# Patient Record
Sex: Female | Born: 1945
Health system: Southern US, Community
[De-identification: ages and names within clinical notes are randomized; demographics above are authoritative.]

## PROBLEM LIST (undated history)

## (undated) DIAGNOSIS — M353 Polymyalgia rheumatica: Secondary | ICD-10-CM

## (undated) DIAGNOSIS — N189 Chronic kidney disease, unspecified: Secondary | ICD-10-CM

## (undated) DIAGNOSIS — I10 Essential (primary) hypertension: Secondary | ICD-10-CM

## (undated) DIAGNOSIS — M1990 Unspecified osteoarthritis, unspecified site: Secondary | ICD-10-CM

## (undated) DIAGNOSIS — A09 Infectious gastroenteritis and colitis, unspecified: Secondary | ICD-10-CM

## (undated) DIAGNOSIS — E785 Hyperlipidemia, unspecified: Secondary | ICD-10-CM

## (undated) DIAGNOSIS — D496 Neoplasm of unspecified behavior of brain: Secondary | ICD-10-CM

## (undated) HISTORY — PX: ABDOMINAL HYSTERECTOMY: SHX81

## (undated) HISTORY — PX: CRANIECTOMY FOR EXCISION OF ACOUSTIC NEUROMA: SUR324

## (undated) HISTORY — DX: Hyperlipidemia, unspecified: E78.5

## (undated) HISTORY — DX: Essential (primary) hypertension: I10

## (undated) HISTORY — DX: Infectious gastroenteritis and colitis, unspecified: A09

## (undated) HISTORY — DX: Polymyalgia rheumatica: M35.3

## (undated) HISTORY — PX: COLONOSCOPY: SHX174

---

## 2004-04-21 ENCOUNTER — Ambulatory Visit: Payer: Self-pay | Admitting: Internal Medicine

## 2004-05-16 ENCOUNTER — Ambulatory Visit: Payer: Self-pay | Admitting: Internal Medicine

## 2005-09-27 ENCOUNTER — Ambulatory Visit: Payer: Self-pay | Admitting: Infectious Diseases

## 2007-01-13 ENCOUNTER — Ambulatory Visit: Payer: Self-pay | Admitting: Internal Medicine

## 2008-08-19 LAB — HM DEXA SCAN: HM Dexa Scan: NORMAL

## 2008-09-23 ENCOUNTER — Ambulatory Visit: Payer: Self-pay | Admitting: Internal Medicine

## 2011-02-02 ENCOUNTER — Telehealth: Payer: Self-pay | Admitting: *Deleted

## 2011-02-02 DIAGNOSIS — Z780 Asymptomatic menopausal state: Secondary | ICD-10-CM

## 2011-02-02 MED ORDER — ESTRADIOL 0.05 MG/24HR TD PTTW: 1.0000 | MEDICATED_PATCH | TRANSDERMAL | Status: DC

## 2011-02-02 NOTE — Telephone Encounter (Signed)
Patient is currently on Estradiol 0.5 mg tabs daily, but it is on back order, Pharmacy is asking if she can be switched to patches. They currently have 0.075mg  and 0.05mg  available.

## 2011-02-02 NOTE — Telephone Encounter (Signed)
vivelle patch was prescirbed to [facility]

## 2011-03-14 ENCOUNTER — Ambulatory Visit (INDEPENDENT_AMBULATORY_CARE_PROVIDER_SITE_OTHER): Payer: Medicare Other | Admitting: Internal Medicine

## 2011-03-14 ENCOUNTER — Encounter: Payer: Self-pay | Admitting: Internal Medicine

## 2011-03-14 DIAGNOSIS — Z780 Asymptomatic menopausal state: Secondary | ICD-10-CM

## 2011-03-14 DIAGNOSIS — Z79899 Other long term (current) drug therapy: Secondary | ICD-10-CM

## 2011-03-14 DIAGNOSIS — M353 Polymyalgia rheumatica: Secondary | ICD-10-CM

## 2011-03-14 DIAGNOSIS — Z23 Encounter for immunization: Secondary | ICD-10-CM

## 2011-03-14 DIAGNOSIS — I10 Essential (primary) hypertension: Secondary | ICD-10-CM

## 2011-03-14 DIAGNOSIS — E785 Hyperlipidemia, unspecified: Secondary | ICD-10-CM

## 2011-03-14 DIAGNOSIS — N951 Menopausal and female climacteric states: Secondary | ICD-10-CM

## 2011-03-14 MED ORDER — SIMVASTATIN 40 MG PO TABS: 40.0000 mg | ORAL_TABLET | Freq: Every day | ORAL | Status: DC

## 2011-03-14 MED ORDER — ESTRADIOL 0.05 MG/24HR TD PTTW: 1.0000 | MEDICATED_PATCH | TRANSDERMAL | Status: DC

## 2011-03-14 NOTE — Progress Notes (Signed)
  Subjective:    Patient ID: Kaitlyn Roman, female    DOB: 1946-01-13, 65 y.o.   MRN: [id number]  HPI   [name] is a 65 yo white female with a history of hypertension, hyperlipidemia and  polymyalgia rheumatica who presents for 6 month followup.  She has been having increased pain in both hand and both feet, without any change in activity. Her foot pain has made exercise difficult to fo. Sheha snot taken prednisone in several months sHer rheumoatologist wants [place] to check ESR and x rays prior to f/u appt for PMR.   She  denies myalgias, any recent trauma. No fevers, hip or shoulder pain.  Past Medical History  Diagnosis Date  . Polymyalgia rheumatica syndrome     rheumatoloigst Dr Freida Busman at [facility]  . Hypertension     No current outpatient prescriptions on file prior to visit.     Review of Systems  Constitutional: Negative for fever, chills, fatigue and unexpected weight change.  HENT: Negative for hearing loss, ear pain, nosebleeds, congestion, sore throat, facial swelling, rhinorrhea, sneezing, mouth sores, trouble swallowing, neck pain, neck stiffness, voice change, postnasal drip, sinus pressure, tinnitus and ear discharge.   Eyes: Negative for pain, discharge, redness and visual disturbance.  Respiratory: Negative for cough, chest tightness, shortness of breath, wheezing and stridor.   Cardiovascular: Negative for chest pain, palpitations and leg swelling.  Musculoskeletal: Positive for joint swelling and arthralgias. Negative for myalgias.  Skin: Negative for color change and rash.  Neurological: Negative for dizziness, weakness, light-headedness and headaches.  Hematological: Negative for adenopathy.       Objective:   Physical Exam  Constitutional: She is oriented to person, place, and time. She appears well-developed and well-nourished.  HENT:  Mouth/Throat: Oropharynx is clear and moist.  Eyes: EOM are normal. Pupils are equal, round, and reactive to light. No  scleral icterus.  Neck: Normal range of motion. Neck supple. No JVD present. No thyromegaly present.  Cardiovascular: Normal rate, regular rhythm, normal heart sounds and intact distal pulses.   Pulmonary/Chest: Effort normal and breath sounds normal.  Abdominal: Soft. Bowel sounds are normal. She exhibits no mass. There is no tenderness.  Musculoskeletal: Normal range of motion. She exhibits tenderness. She exhibits no edema.       Left ankle: tenderness. Head of 5th metatarsal tenderness found.       Feet:  Lymphadenopathy:    She has no cervical adenopathy.  Neurological: She is alert and oriented to person, place, and time.  Skin: Skin is warm and dry.  Psychiatric: She has a normal mood and affect.          Assessment & Plan:  Joint pain:  The joints currently affected are not typical for PMR. However, her rheumatologist is requesting radiographs of hands and feet. ESR was normal, so the more likely cause of current symptoms is OA

## 2011-03-14 NOTE — Patient Instructions (Signed)
We will send results of x rays and labs to your rheumatologist  Dr. Freida Busman  Return for fasting labs as soon as convenient.  followup in  6 months

## 2011-03-15 ENCOUNTER — Telehealth: Payer: Self-pay | Admitting: *Deleted

## 2011-03-15 ENCOUNTER — Other Ambulatory Visit (INDEPENDENT_AMBULATORY_CARE_PROVIDER_SITE_OTHER): Payer: Medicare Other | Admitting: *Deleted

## 2011-03-15 DIAGNOSIS — Z79899 Other long term (current) drug therapy: Secondary | ICD-10-CM

## 2011-03-15 DIAGNOSIS — E785 Hyperlipidemia, unspecified: Secondary | ICD-10-CM

## 2011-03-15 DIAGNOSIS — M353 Polymyalgia rheumatica: Secondary | ICD-10-CM

## 2011-03-15 LAB — COMPREHENSIVE METABOLIC PANEL
ALT: 13 U/L (ref 0–35)
AST: 22 U/L (ref 0–37)
Albumin: 4.5 g/dL (ref 3.5–5.2)
Alkaline Phosphatase: 59 U/L (ref 39–117)
BUN: 22 mg/dL (ref 6–23)
CO2: 28 mEq/L (ref 19–32)
Calcium: 9.3 mg/dL (ref 8.4–10.5)
Chloride: 103 mEq/L (ref 96–112)
Creatinine, Ser: 0.8 mg/dL (ref 0.4–1.2)
GFR: 79.32 mL/min (ref >60.00)
Glucose, Bld: 94 mg/dL (ref 70–99)
Potassium: 4.2 mEq/L (ref 3.5–5.1)
Sodium: 140 mEq/L (ref 135–145)
Total Bilirubin: 0.8 mg/dL (ref 0.3–1.2)
Total Protein: 7.5 g/dL (ref 6.0–8.3)

## 2011-03-15 LAB — SEDIMENTATION RATE: Sed Rate: 15 mm/hr (ref 0–22)

## 2011-03-15 LAB — LIPID PANEL
Cholesterol: 218 mg/dL — ABNORMAL HIGH (ref 0–200)
HDL: 83.9 mg/dL (ref >39.00)
Total CHOL/HDL Ratio: 3
Triglycerides: 77 mg/dL (ref 0.0–149.0)
VLDL: 15.4 mg/dL (ref 0.0–40.0)

## 2011-03-15 LAB — LDL CHOLESTEROL, DIRECT: Direct LDL: 122.8 mg/dL

## 2011-03-15 MED ORDER — BENZONATATE 200 MG PO CAPS: 200.0000 mg | ORAL_CAPSULE | Freq: Three times a day (TID) | ORAL | Status: AC | PRN

## 2011-03-15 NOTE — Telephone Encounter (Signed)
Without fevers or productive cough she does not need an antibiotic because it is most likely viral. .  You can offer her a cough medicine tessalon 200 mg one tablet every 8 hours prn cough  #30 1 refill.

## 2011-03-15 NOTE — Telephone Encounter (Signed)
Patient says that she forgot to mention to you at her visit yesterday that she is having some congestion, a non productive cough. Says that it is much worse at night and feels like her chest is tight when she is laying down. Has had no fever or any other symptoms. She is asking if she could have something called in since she was just seen yesterday.

## 2011-03-15 NOTE — Telephone Encounter (Signed)
Patient notified by message, rx for tessalon sent to pharmacy.

## 2011-03-16 MED ORDER — ATORVASTATIN CALCIUM 40 MG PO TABS: 40.0000 mg | ORAL_TABLET | Freq: Every day | ORAL | Status: DC

## 2011-03-16 NOTE — Progress Notes (Signed)
Addended by: Duncan Dull on: 03/16/2011 09:03 AM   Modules accepted: Orders

## 2011-03-17 ENCOUNTER — Encounter: Payer: Self-pay | Admitting: Internal Medicine

## 2011-03-17 DIAGNOSIS — E785 Hyperlipidemia, unspecified: Secondary | ICD-10-CM | POA: Insufficient documentation

## 2011-03-17 DIAGNOSIS — I10 Essential (primary) hypertension: Secondary | ICD-10-CM | POA: Insufficient documentation

## 2011-03-17 NOTE — Assessment & Plan Note (Signed)
Well controlled currently wih no evidence of end organ damage by current BMET.  No changes today

## 2011-03-17 NOTE — Assessment & Plan Note (Addendum)
LDL currently 122 and HDL 84 on lipitor.  Given her high HDL, will not increase dose of  Statin

## 2011-03-19 ENCOUNTER — Telehealth: Payer: Self-pay | Admitting: *Deleted

## 2011-03-19 MED ORDER — AZITHROMYCIN 500 MG PO TABS: 500.0000 mg | ORAL_TABLET | Freq: Every day | ORAL | Status: AC

## 2011-03-19 NOTE — Telephone Encounter (Signed)
Since it is now going on for over 5 days,  Please call her in Azithromycin 500 mg one tablet daily for 7 days #7 no refills. Sudafed PR 10 mg every 6 hours as needed for congestion.

## 2011-03-19 NOTE — Telephone Encounter (Signed)
Patient notified

## 2011-03-19 NOTE — Telephone Encounter (Signed)
Patient says that she is still having a lot of congestion, still has cough (non productive). She has has low grade fever a couple of times. She is taking the tessalon and has also tried mucinex and nothing is helping. She is asking if there is anything else that she can try.

## 2011-03-23 ENCOUNTER — Ambulatory Visit: Payer: Self-pay | Admitting: Internal Medicine

## 2011-04-04 ENCOUNTER — Encounter: Payer: Self-pay | Admitting: Internal Medicine

## 2011-05-15 ENCOUNTER — Other Ambulatory Visit: Payer: Self-pay | Admitting: Internal Medicine

## 2011-05-15 DIAGNOSIS — E785 Hyperlipidemia, unspecified: Secondary | ICD-10-CM

## 2011-05-15 DIAGNOSIS — Z780 Asymptomatic menopausal state: Secondary | ICD-10-CM

## 2011-05-15 MED ORDER — FLUOCINONIDE 0.05 % EX OINT: 1.0000 "application " | TOPICAL_OINTMENT | Freq: Two times a day (BID) | CUTANEOUS | Status: DC

## 2011-05-15 MED ORDER — ESTRADIOL 0.05 MG/24HR TD PTTW: 1.0000 | MEDICATED_PATCH | TRANSDERMAL | Status: DC

## 2011-05-15 MED ORDER — MELOXICAM 15 MG PO TABS: 15.0000 mg | ORAL_TABLET | Freq: Every day | ORAL | Status: DC

## 2011-05-15 MED ORDER — LOSARTAN POTASSIUM 100 MG PO TABS: 100.0000 mg | ORAL_TABLET | Freq: Every day | ORAL | Status: DC

## 2011-05-15 MED ORDER — ATORVASTATIN CALCIUM 40 MG PO TABS: 40.0000 mg | ORAL_TABLET | Freq: Every day | ORAL | Status: DC

## 2011-05-15 NOTE — Telephone Encounter (Signed)
New rx's printed >  Only 4 refills on some of them because she will need CMET in April for monitoring of liver/kidney funcction

## 2011-05-15 NOTE — Telephone Encounter (Signed)
Patient needs written scripts for all of these meds, she wants [place] to call her when they are ready and she will pick them up.

## 2011-05-15 NOTE — Telephone Encounter (Signed)
Patient brought in a list of medication that she needs a paper prescription for.  They were Fluocinonide ointment USP,0.05%, atorvastatin 40 mg, esterdiol 0.5 mg tab not the patch, losartin 100 mg, and meloxiam 15 mg.  She would have them called into a pharmacy however she is changing insurance companies at the first of the year.  Please call when they are ready for pick up and she will pick them up. Thanks

## 2011-05-16 NOTE — Telephone Encounter (Signed)
Rx's ready for pick up will be left at front desk.  Left message on cell phone voicemail advising patient as instructed.

## 2011-06-04 ENCOUNTER — Telehealth: Payer: Self-pay | Admitting: Internal Medicine

## 2011-06-04 MED ORDER — ESTRADIOL 0.5 MG PO TABS: 0.5000 mg | ORAL_TABLET | Freq: Every day | ORAL | Status: DC

## 2011-06-04 NOTE — Telephone Encounter (Signed)
Thank you :)

## 2011-06-04 NOTE — Telephone Encounter (Signed)
Tablets sent to [facility].

## 2011-06-04 NOTE — Telephone Encounter (Signed)
Patient is needing a prescription correction . Her estradiol was sent into [facility] as a patch when she takes a tablet at 0.5 mg.

## 2011-06-06 ENCOUNTER — Other Ambulatory Visit: Payer: Self-pay | Admitting: *Deleted

## 2011-06-06 MED ORDER — ESTRADIOL 0.5 MG PO TABS: 0.5000 mg | ORAL_TABLET | Freq: Every day | ORAL | Status: DC

## 2011-06-06 NOTE — Telephone Encounter (Signed)
Rx was sent to wrong pharmacy yesterday. Sent rx to [facility] and canceled at [facility].

## 2011-08-20 DIAGNOSIS — H269 Unspecified cataract: Secondary | ICD-10-CM | POA: Diagnosis not present

## 2011-08-20 DIAGNOSIS — Z961 Presence of intraocular lens: Secondary | ICD-10-CM | POA: Diagnosis not present

## 2011-08-20 DIAGNOSIS — H2510 Age-related nuclear cataract, unspecified eye: Secondary | ICD-10-CM | POA: Diagnosis not present

## 2011-08-21 DIAGNOSIS — H2510 Age-related nuclear cataract, unspecified eye: Secondary | ICD-10-CM | POA: Diagnosis not present

## 2011-08-27 DIAGNOSIS — A09 Infectious gastroenteritis and colitis, unspecified: Secondary | ICD-10-CM

## 2011-08-27 HISTORY — DX: Infectious gastroenteritis and colitis, unspecified: A09

## 2011-08-28 ENCOUNTER — Ambulatory Visit (INDEPENDENT_AMBULATORY_CARE_PROVIDER_SITE_OTHER): Payer: Medicare Other | Admitting: Internal Medicine

## 2011-08-28 ENCOUNTER — Encounter: Payer: Self-pay | Admitting: Internal Medicine

## 2011-08-28 VITALS — BP 144/80 | HR 70 | Temp 98.5°F | Resp 16 | Wt 127.8 lb

## 2011-08-28 DIAGNOSIS — M4847XD Fatigue fracture of vertebra, lumbosacral region, subsequent encounter for fracture with routine healing: Secondary | ICD-10-CM

## 2011-08-28 DIAGNOSIS — R5383 Other fatigue: Secondary | ICD-10-CM

## 2011-08-28 DIAGNOSIS — E538 Deficiency of other specified B group vitamins: Secondary | ICD-10-CM | POA: Diagnosis not present

## 2011-08-28 DIAGNOSIS — M1990 Unspecified osteoarthritis, unspecified site: Secondary | ICD-10-CM

## 2011-08-28 DIAGNOSIS — J069 Acute upper respiratory infection, unspecified: Secondary | ICD-10-CM

## 2011-08-28 DIAGNOSIS — Z79899 Other long term (current) drug therapy: Secondary | ICD-10-CM

## 2011-08-28 DIAGNOSIS — M353 Polymyalgia rheumatica: Secondary | ICD-10-CM | POA: Diagnosis not present

## 2011-08-28 DIAGNOSIS — E785 Hyperlipidemia, unspecified: Secondary | ICD-10-CM | POA: Diagnosis not present

## 2011-08-28 DIAGNOSIS — G629 Polyneuropathy, unspecified: Secondary | ICD-10-CM | POA: Insufficient documentation

## 2011-08-28 DIAGNOSIS — I10 Essential (primary) hypertension: Secondary | ICD-10-CM | POA: Diagnosis not present

## 2011-08-28 LAB — COMPLETE METABOLIC PANEL WITH GFR
ALT: 12 U/L (ref 0–35)
AST: 24 U/L (ref 0–37)
Albumin: 4.5 g/dL (ref 3.5–5.2)
Alkaline Phosphatase: 62 U/L (ref 39–117)
BUN: 26 mg/dL — ABNORMAL HIGH (ref 6–23)
CO2: 22 mEq/L (ref 19–32)
Calcium: 9.4 mg/dL (ref 8.4–10.5)
Chloride: 104 mEq/L (ref 96–112)
Creat: 0.67 mg/dL (ref 0.50–1.10)
GFR, Est African American: >89 mL/min
GFR, Est Non African American: >89 mL/min
Glucose, Bld: 91 mg/dL (ref 70–99)
Potassium: 4.7 mEq/L (ref 3.5–5.3)
Sodium: 139 mEq/L (ref 135–145)
Total Bilirubin: 0.5 mg/dL (ref 0.3–1.2)
Total Protein: 6.7 g/dL (ref 6.0–8.3)

## 2011-08-28 LAB — LIPID PANEL
Cholesterol: 187 mg/dL (ref 0–200)
HDL: 76.1 mg/dL (ref >39.00)
LDL Cholesterol: 99 mg/dL (ref 0–99)
Total CHOL/HDL Ratio: 2
Triglycerides: 62 mg/dL (ref 0.0–149.0)
VLDL: 12.4 mg/dL (ref 0.0–40.0)

## 2011-08-28 LAB — VITAMIN B12: Vitamin B-12: 765 pg/mL (ref 211–911)

## 2011-08-28 LAB — TSH: TSH: 1.28 u[IU]/mL (ref 0.35–5.50)

## 2011-08-28 MED ORDER — LOSARTAN POTASSIUM 100 MG PO TABS: 100.0000 mg | ORAL_TABLET | Freq: Every day | ORAL | Status: DC

## 2011-08-28 MED ORDER — MELOXICAM 15 MG PO TABS: 15.0000 mg | ORAL_TABLET | Freq: Every day | ORAL | Status: DC

## 2011-08-28 MED ORDER — ATORVASTATIN CALCIUM 40 MG PO TABS: 40.0000 mg | ORAL_TABLET | Freq: Every day | ORAL | Status: DC

## 2011-08-28 NOTE — Patient Instructions (Signed)
You have a viral  Syndrome .  The post nasal drip is causing your sore throat.  Lavage your sinuses twice daily with Simply Saline nasal spray.  Use benadryl 25 mg every 8 hours and Sudafed PE 10 to 30 every 8 hours as needed for post nasal drainage and congestion.  Gargle with salt water as needed for sore throat.  Mucinex thins out mucus and breaks it up., (d = decongestant,  Dm = cough suppressant)  I will call  in Cheratussin cough syrup (has codeine) for the cough.  If the throat is no better  In 3 to 4 days OR  if you develop T > 100.4,  Green nasal discharge,  Or facial pain,  Call for an antibiotic.

## 2011-08-28 NOTE — Assessment & Plan Note (Signed)
Bilateral parasthesias affecting tips of both hands .  Negative Tinels and Phalen's sign,  Recommended cervical neck pillow. Serologies to ule ou  12 and thyroid deficiency

## 2011-08-28 NOTE — Assessment & Plan Note (Signed)
Now off of prednisone, using meloxicm and prn tramadol

## 2011-08-28 NOTE — Assessment & Plan Note (Signed)
Well controlled on current medications.  No changes today. 

## 2011-08-28 NOTE — Progress Notes (Signed)
Patient ID: Kaitlyn Roman, female   DOB: 1946-05-15, 66 y.o.   MRN: [id number]    Patient Active Problem List  Diagnoses  . Hyperlipidemia  . Hypertension  . OA (osteoarthritis)  . Polymyalgia rheumatica  . URI (upper respiratory infection)  . Neuropathy    Subjective:  CC:   Chief Complaint  Patient presents with  . Follow-up    HPI:   Kaitlyn O'Ferrellis a 66 y.o. female who presents 6 month follow up on PMR, hypertension and hyperlipidemia,  Kaitlyn Roman did her Right cataract surgery with lens implant  last Monday .  Left is  scheduled for April 22 .  No complications thus far.  Her arthritis is now managed with meloxicam and tramadol,  Has not been taking prednisone for several months.  Has noted tingling,  parasthesias in the tips of fingers of both hands,  More prominent in the early morning. Has been suffering from sinus congestion and PND of two days duration.  6 month follow up on PMR, hypertension and hyperlipidemia,  Kaitlyn Roman did her Right cataract surgery with lens implant  last Monday .  Left is  scheduled for April 22 .  No complications thus far.  Her arthritis is now managed with meloxicam and tramadol,  Has not been taking prednisone for several months.  Has noted tingling,  parasthesias in the tips of fingers of both hands,  More prominent in the early morning. Has been suffering from sinus congestion and PND of two days duration.    Past Medical History  Diagnosis Date  . Polymyalgia rheumatica syndrome     rheumatoloigst Dr Freida Busman at [facility]  . Hyperlipidemia   . Hypertension     No past surgical history on file.       The following portions of the patient's history were reviewed and updated as appropriate: Allergies, current medications, and problem list.    Review of Systems:   12 Pt  review of systems was negative except those addressed in the HPI,     History   Social History  . Marital Status: Married    Spouse Name: N/A      Number of Children: N/A  . Years of Education: N/A   Occupational History  . Not on file.   Social History Main Topics  . Smoking status: Never Smoker   . Smokeless tobacco: Never Used  . Alcohol Use: Yes     one cocctail nightly   . Drug Use: No  . Sexually Active: Not on file   Other Topics Concern  . Not on file   Social History Narrative  . No narrative on file    Objective:  BP 144/80  Pulse 70  Temp(Src) 98.5 F (36.9 C) (Oral)  Resp 16  Wt 127 lb 12 oz (57.947 kg)  SpO2 100%  General appearance: alert, cooperative and appears stated age Ears: normal TM's and external ear canals both ears Throat: lips, mucosa, and tongue normal; teeth and gums normal Neck: no adenopathy, no carotid bruit, supple, symmetrical, trachea midline and thyroid not enlarged, symmetric, no tenderness/mass/nodules Back: symmetric, no curvature. ROM normal. No CVA tenderness. Lungs: clear to auscultation bilaterally Heart: regular rate and rhythm, S1, S2 normal, no murmur, click, rub or gallop Abdomen: soft, non-tender; bowel sounds normal; no masses,  no organomegaly Pulses: 2+ and symmetric Skin: Skin color, texture, turgor normal. No rashes or lesions Lymph nodes: Cervical, supraclavicular, and axillary nodes normal.  Assessment and Plan:  Hypertension Well  controlled on current medications.  No changes today.  Polymyalgia rheumatica Now off of prednisone, using meloxicm and prn tramadol   Neuropathy Bilateral parasthesias affecting tips of both hands .  Negative Tinels and Phalen's sign,  Recommended cervical neck pillow. Serologies to ule ou  12 and thyroid deficiency     Updated Medication List Outpatient Encounter Prescriptions as of 08/28/2011  Medication Sig Dispense Refill  . aspirin 81 MG tablet Take 81 mg by mouth daily.      [name] atorvastatin (LIPITOR) 40 MG tablet Take 1 tablet (40 mg total) by mouth daily.  30 tablet  4  . estradiol (ESTRACE) 0.5 MG tablet Take 1  tablet (0.5 mg total) by mouth daily.  30 tablet  11  . fluocinonide ointment (LIDEX) 0.05 % Apply 1 application topically 2 (two) times daily.  30 g  11  . losartan (COZAAR) 100 MG tablet Take 1 tablet (100 mg total) by mouth daily.  30 tablet  4  . meloxicam (MOBIC) 15 MG tablet Take 1 tablet (15 mg total) by mouth daily.  30 tablet  4  . traMADol (ULTRAM) 50 MG tablet Take 50 mg by mouth every 6 (six) hours as needed.        [name] DISCONTD: atorvastatin (LIPITOR) 40 MG tablet Take 1 tablet (40 mg total) by mouth daily.  30 tablet  4  . DISCONTD: losartan (COZAAR) 100 MG tablet Take 1 tablet (100 mg total) by mouth daily.  30 tablet  4  . DISCONTD: meloxicam (MOBIC) 15 MG tablet Take 1 tablet (15 mg total) by mouth daily.  30 tablet  4     Orders Placed This Encounter  Procedures  . TSH  . Lipid panel  . COMPLETE METABOLIC PANEL WITH GFR  . Vitamin B12    No Follow-up on file.

## 2011-08-31 ENCOUNTER — Telehealth: Payer: Self-pay | Admitting: Internal Medicine

## 2011-08-31 DIAGNOSIS — J329 Chronic sinusitis, unspecified: Secondary | ICD-10-CM

## 2011-08-31 MED ORDER — AMOXICILLIN-POT CLAVULANATE 875-125 MG PO TABS: 1.0000 | ORAL_TABLET | Freq: Two times a day (BID) | ORAL | Status: AC

## 2011-08-31 NOTE — Telephone Encounter (Signed)
Patient notified of Rx.  

## 2011-08-31 NOTE — Telephone Encounter (Signed)
Caller: Kaitlyn Roman/Patient; PCP: Duncan Dull; CB#: [phone]; ; ; Call regarding Prescription.  Seen in office 08/28/11 and told had viral syndrome, but if sore throat and sinus congestion did not improve, to call back and Dr. Darrick Huntsman would call in antibiotic.  Per epic, note from visit 08/28/11 does say to call back for antibiotic if not improved in 3-4 days.  INFO TO OFFICE FOR PROVIDER REVIEW/RX/CALLBACK.  Uses [facility]/[address]  MAY REACH PATIENT AT [phone] home or [phone] cell.

## 2011-08-31 NOTE — Telephone Encounter (Signed)
rx for augmentin sent via e mail to [facility] for patient's continued sinus symptoms.  contineu decongestants, saline rinses etc.

## 2011-08-31 NOTE — Telephone Encounter (Signed)
[facility] Triage Call Report Triage Record Num: [id number] Operator: Chevis Pretty Patient Name: Kaitlyn Roman Call Date & Time: 08/31/2011 9:49:07AM Patient Phone: [phone] PCP: Duncan Dull Patient Gender: Female PCP Fax : [phone] Patient DOB: 16-Nov-1945 Practice Name: [facility] Reason for Call: Caller: [name]; PCP: Duncan Dull; CB#: [phone]; ; ; Call regarding Prescription. Seen in office 08/28/11 and told had viral syndrome, but if sore throat and sinus congestion did not improve, to call back and Dr. Darrick Huntsman would call in antibiotic. Per epic, note from visit 08/28/11 does say to call back for antibiotic if not improved in 3-4 days. INFO TO OFFICE FOR PROVIDER REVIEW/RX/CALLBACK. Uses [facility] MAY REACH PATIENT AT [phone] home or [phone] cell. Protocol(s) Used: Medication Questions - Adult Recommended Outcome per Protocol: Call Dispensing Pharmacy or Provider Immediately Reason for Outcome: Requests refill of medication that poses clinical risk to patient if not available within 8 hours Care Advice: ~ Have pharmacy phone number and prescription information available when you speak with provider. If valid refills are available, it will be noted on original container. If container no longer available, prescribing pharmacy will have a record if refills are available. ~ ~ Ask pharmacist if they can do a partial refill. 08/31/2011 9:57:09AM Page 1 of 1 [email or web address]

## 2011-08-31 NOTE — Telephone Encounter (Signed)
See below note which did not get routed to you and forward to patient .  thanks

## 2011-09-05 ENCOUNTER — Telehealth: Payer: Self-pay | Admitting: Internal Medicine

## 2011-09-05 MED ORDER — GUAIFENESIN-CODEINE 100-10 MG/5ML PO SYRP: ORAL_SOLUTION | ORAL | Status: DC

## 2011-09-05 NOTE — Telephone Encounter (Signed)
Ok to call in cheratussin 1 tablesppon every 6 hours as needed for cough 200 ml

## 2011-09-05 NOTE — Telephone Encounter (Signed)
Patient is calling [phone] to ask if Rx for cough syrup has been called in to [facility].  She requests call from office with response when this has been done (or if not)  Thank you.

## 2011-09-05 NOTE — Telephone Encounter (Signed)
Caller: Dazha/Patient; PCP: Duncan Dull; CB#: [phone]; Patient calling to check and  see if her request for Rx has been called to [facility].  Patient requests phone call from office when this has been done.  Information noted and sent to office.

## 2011-09-05 NOTE — Telephone Encounter (Signed)
Caller: Sofija/Patient; PCP: Duncan Dull; CB#: [phone]; Call regarding Cough; Requests Rx. Cough Med; States she was seen in office on 08/28/11 for same.  Called back and got atbx on 08/31/11.  Green sputum reported.  Deep cough reported.  Fever up to 101.7 oral  on 09/04/11; slept better and denies fever on 09/05/11.    Advised see in 24 hours per nursing judgment and Cough protocol.  Appt at 0815 on 09/06/11 per instructions from office as no appt available this date.   Caller also asks for Cheratussin ( with Codeine)  to be called to [facility] on [address] [phone].

## 2011-09-05 NOTE — Telephone Encounter (Signed)
[facility] Triage Call Report Triage Record Num: [id number] Operator: Tomasita Crumble Patient Name: Kaitlyn Roman Call Date & Time: 09/05/2011 10:06:27AM Patient Phone: [phone] PCP: Duncan Dull Patient Gender: Female PCP Fax : [phone] Patient DOB: Dec 22, 1945 Practice Name: [facility] Reason for Call: Caller: Mannat/Patient; PCP: Duncan Dull; CB#: [phone]; Call regarding Cough; Requests Rx. Cough Med; States she was seen in office on 08/28/11 for same. Called back and got atbx on 08/31/11. Green sputum reported. Deep cough reported. Fever up to 101.7 oral on 09/04/11; slept better and denies fever on 09/05/11. Advised see in 24 hours per nursing judgment and Cough protocol. Appt at 0815 on 09/06/11 per instructions from office as no appt available this date. Caller also asks for Cheratussin ( with Codeine) to be called to [facility] on [address] [phone]. Protocol(s) Used: Cough - Adult Recommended Outcome per Protocol: Call Provider Immediately Override Outcome if Used in Protocol: See Provider within 24 hours RN Reason for Override Outcome: Nursing Judgement Used. Reason for Outcome: Any temperature elevation in an immunocompromised individual or a frail elderly person Care Advice: ~ Another adult should drive. ~ List, or take, all current prescription(s), nonprescription or alternative medication(s) to provider for evaluation. Most adults need to drink 6-10 eight-ounce glasses (1.2-2.0 liters) of fluids per day unless previously told to limit fluid intake for other medical reasons. Limit fluids that contain caffeine, sugar or alcohol. Urine will be a very light yellow color when you drink enough fluids. ~ Systemic Inflammatory Response Syndrome (SIRS): Watch for signs of a generalized, whole body infection. Occurs within days of a localized infection, especially of the urinary, GI, respiratory or nervous systems; or after a traumatic injury or  invasive procedure. - Call EMS 911 if symptoms have worsened, such as increasing confusion or unusual drowsiness; cold and clammy skin; no urine output; rapid respiration (>30/min.) or slow respiration (<10/min.); struggling to breathe. - Go to the ED immediately for early symptoms of rapid pulse >90/min. or rapid breathing >20/min. at rest; chills; oral temperature >100.4 F (38 C) or <96.8 F (36 C) when associated with conditions noted. ~ Analgesic/Antipyretic Advice - NSAIDs: Consider aspirin, ibuprofen, naproxen or ketoprofen for pain or fever as directed on label or by pharmacist/provider. PRECAUTIONS: - If over 34 years of age, should not take longer than 1 week without consulting provider. EXCEPTIONS: - Should not be used if taking blood thinners or have bleeding problems. - Do not use if have history of sensitivity/allergy to any of these medications; or history of cardiovascular, ulcer, kidney, liver disease or diabetes unless approved by provider. - Do not exceed recommended dose or frequency. ~ 09/05/2011 10:29:58AM Page 1 of 1 [email or web address]

## 2011-09-05 NOTE — Telephone Encounter (Signed)
rx has been called in

## 2011-09-05 NOTE — Telephone Encounter (Signed)
Office Message [address] [place], [place] [address] p. [phone] f. [phone] To: [facility] (Daytime Triage) Fax: [phone] From: [facility] Date/ Time: 09/04/2011 5:25 PM Taken By: Lesli Albee, RN Caller: Marylu Lund Facility: Not Collected Patient: Kaitlyn Roman, Kaitlyn Roman DOB: 20-May-1946 Phone: [phone] Reason for Call: Caller was unable to be reached on callback - Left Message Regarding Appointment: No Appt Date: Appt Time: Unknown Provider: Reason: Details: Outcome:

## 2011-09-06 ENCOUNTER — Encounter: Payer: Self-pay | Admitting: Internal Medicine

## 2011-09-06 ENCOUNTER — Ambulatory Visit: Payer: Medicare Other | Admitting: Internal Medicine

## 2011-09-06 ENCOUNTER — Ambulatory Visit (INDEPENDENT_AMBULATORY_CARE_PROVIDER_SITE_OTHER): Payer: Medicare Other | Admitting: Internal Medicine

## 2011-09-06 VITALS — BP 126/80 | HR 74 | Temp 99.8°F | Resp 16 | Wt 125.0 lb

## 2011-09-06 DIAGNOSIS — J0410 Acute tracheitis without obstruction: Secondary | ICD-10-CM | POA: Diagnosis not present

## 2011-09-06 MED ORDER — DOXYCYCLINE HYCLATE 100 MG PO TABS: 100.0000 mg | ORAL_TABLET | Freq: Two times a day (BID) | ORAL | Status: AC

## 2011-09-06 NOTE — Progress Notes (Signed)
Patient ID: Kaitlyn Roman, female   DOB: 12-12-1945, 66 y.o.   MRN: [id number]  Patient Active Problem List  Diagnoses  . Hyperlipidemia  . Hypertension  . OA (osteoarthritis)  . Polymyalgia rheumatica  . URI (upper respiratory infection)  . Neuropathy  . Tracheitis    Subjective:  CC:   Chief Complaint  Patient presents with  . Cough  . Fatigue  . Generalized Body Aches    HPI:   Kaitlyn Roman a 66 y.o. female who presents with persistent deep productive cough accompanied by fevers which started 4 days after starting augmentin .  Tmax 101 on Tuesday.  She reports diffuse myalgias with fevers, and significant malaise   she denies persistent sinus congestion or pain and has no symptoms of postnasal drip or ear or pain as well. She feels her current symptoms are all coming from her chest. She does have chest pain with cough. She denies wheezing dyspnea and pleuritic chest pain .    Past Medical History  Diagnosis Date  . Polymyalgia rheumatica syndrome     rheumatoloigst Dr Freida Busman at [facility]  . Hyperlipidemia   . Hypertension     History reviewed. No pertinent past surgical history.       The following portions of the patient's history were reviewed and updated as appropriate: Allergies, current medications, and problem list.    Review of Systems:   12 Pt  review of systems was negative except those addressed in the HPI,     History   Social History  . Marital Status: Married    Spouse Name: N/A    Number of Children: N/A  . Years of Education: N/A   Occupational History  . Not on file.   Social History Main Topics  . Smoking status: Never Smoker   . Smokeless tobacco: Never Used  . Alcohol Use: Yes     one cocctail nightly   . Drug Use: No  . Sexually Active: Not on file   Other Topics Concern  . Not on file   Social History Narrative  . No narrative on file    Objective:  BP 126/80  Pulse 74  Temp(Src) 99.8 F (37.7 C)  (Oral)  Resp 16  Wt 125 lb (56.7 kg)  SpO2 97%  General appearance: alert, cooperative and appears stated age Ears: normal TM's and external ear canals both ears Throat: lips, mucosa, and tongue normal; teeth and gums normal Neck: no adenopathy, no carotid bruit, supple, symmetrical, trachea midline and thyroid not enlarged, symmetric, no tenderness/mass/nodules Back: symmetric, no curvature. ROM normal. No CVA tenderness. Lungs: clear to auscultation bilaterally Heart: regular rate and rhythm, S1, S2 normal, no murmur, click, rub or gallop Abdomen: soft, non-tender; bowel sounds normal; no masses,  no organomegaly Pulses: 2+ and symmetric Skin: Skin color, texture, turgor normal. No rashes or lesions Lymph nodes: Cervical, supraclavicular, and axillary nodes normal.  Assessment and Plan:  Tracheitis influenza test was negative.  Will change therapy to doxycycline  she has had progression of symptoms with Augmentin.     Updated Medication List Outpatient Encounter Prescriptions as of 09/06/2011  Medication Sig Dispense Refill  . amoxicillin-clavulanate (AUGMENTIN) 875-125 MG per tablet Take 1 tablet by mouth 2 (two) times daily.  14 tablet  0  . aspirin 81 MG tablet Take 81 mg by mouth daily.      [name] atorvastatin (LIPITOR) 40 MG tablet Take 20 mg by mouth daily.      [name] estradiol (  ESTRACE) 0.5 MG tablet Take 1 tablet (0.5 mg total) by mouth daily.  30 tablet  11  . fluocinonide ointment (LIDEX) 0.05 % Apply 1 application topically 2 (two) times daily.  30 g  11  . guaiFENesin-codeine (CHERATUSSIN AC) 100-10 MG/5ML syrup 1 tablespoon every 6 hours as needed for cough.  120 mL  0  . losartan (COZAAR) 100 MG tablet Take 1 tablet (100 mg total) by mouth daily.  30 tablet  4  . meloxicam (MOBIC) 15 MG tablet Take 1 tablet (15 mg total) by mouth daily.  30 tablet  4  . traMADol (ULTRAM) 50 MG tablet Take 50 mg by mouth every 6 (six) hours as needed.        [name] DISCONTD: atorvastatin  (LIPITOR) 40 MG tablet Take 1 tablet (40 mg total) by mouth daily.  30 tablet  4  . doxycycline (VIBRA-TABS) 100 MG tablet Take 1 tablet (100 mg total) by mouth 2 (two) times daily.  14 tablet  0     No orders of the defined types were placed in this encounter.    No Follow-up on file.

## 2011-09-09 ENCOUNTER — Encounter: Payer: Self-pay | Admitting: Internal Medicine

## 2011-09-09 NOTE — Assessment & Plan Note (Signed)
influenza test was negative.  Will change therapy to doxycycline  she has had progression of symptoms with Augmentin.

## 2011-09-15 DIAGNOSIS — A09 Infectious gastroenteritis and colitis, unspecified: Secondary | ICD-10-CM | POA: Diagnosis not present

## 2011-09-15 DIAGNOSIS — A049 Bacterial intestinal infection, unspecified: Secondary | ICD-10-CM | POA: Diagnosis not present

## 2011-09-15 DIAGNOSIS — R509 Fever, unspecified: Secondary | ICD-10-CM | POA: Diagnosis not present

## 2011-09-15 DIAGNOSIS — R109 Unspecified abdominal pain: Secondary | ICD-10-CM | POA: Diagnosis not present

## 2011-09-15 DIAGNOSIS — I10 Essential (primary) hypertension: Secondary | ICD-10-CM | POA: Diagnosis not present

## 2011-09-16 DIAGNOSIS — E860 Dehydration: Secondary | ICD-10-CM | POA: Diagnosis not present

## 2011-09-16 DIAGNOSIS — K5289 Other specified noninfective gastroenteritis and colitis: Secondary | ICD-10-CM | POA: Diagnosis not present

## 2011-09-17 ENCOUNTER — Telehealth: Payer: Self-pay | Admitting: *Deleted

## 2011-09-17 NOTE — Telephone Encounter (Signed)
Triage Record Num: [id number] Operator: Albertine Grates Patient Name: Kaitlyn Roman Call Date & Time: 09/14/2011 5:43:16PM Patient Phone: [phone] PCP: Duncan Dull Patient Gender: Female PCP Fax : [phone] Patient DOB: 04-Apr-1946 Practice Name: [facility] Reason for Call: Caller: [name]/Patient; PCP: Duncan Dull; CB#: [phone]; Call regarding Weak, wanting to talk to nurse about what to eat; Has been sick for "3 weeks". Took antibiotic for bronchitis and caused diarrhea. Has had diarrhea x1 4-19. Feels "weak". Abdomen feels "tender to touch". Has taken Imodium 3 tabs. Afebrile. Is voiding well. Is out of town. Will follow up with office 4-22 if not improving. Protocol(s) Used: Diarrhea or Other Change in Bowel Habits Recommended Outcome per Protocol: See Provider within 24 hours Reason for Outcome: Diarrhea began 3-5 days after starting an antibiotic Care Advice: Diarrheal Care: - Drink 2-3 quarts (2-3 liters) per day of low sugar content fluids, including over the counter oral hydration solution, unless directed otherwise by provider. - If accompanied by vomiting, take the fluids in frequent small sips or suck on ice chips. - Eat easily digested foods (such as bananas, rice, applesauce, toast, cooked cereals, soup, crackers, baked or boiled potato, or baked chicken or [place] without skin). - Do not eat high fiber, high fat, high sugar content foods, or highly seasoned foods. - Do not drink caffeinated or alcoholic beverages. - Avoid milk and milk products while having symptoms. As symptoms improve, gradually add back to diet. - Application of A&D ointment or witch hazel medicated pads may help anal irritation. - Antidiarrheal medications are usually unnecessary. If symptoms are severe, consider nonprescription antidiarrheal and anti-motility drugs as directed by label or a provider. Do not take if have high fever or bloody diarrhea. If pregnant, do not take  any medications not approved by your provider. - Consult your provider for advice regarding continuing prescription medication. ~ 04/

## 2011-09-17 NOTE — Telephone Encounter (Signed)
Left message asking patient to call and set up appt when she returns if still having symptoms or if needs to be seen while out of town suggested nearest Urgent care.

## 2011-09-18 ENCOUNTER — Ambulatory Visit (INDEPENDENT_AMBULATORY_CARE_PROVIDER_SITE_OTHER): Payer: Medicare Other | Admitting: *Deleted

## 2011-09-18 VITALS — BP 100/66 | HR 80

## 2011-09-18 DIAGNOSIS — Z136 Encounter for screening for cardiovascular disorders: Secondary | ICD-10-CM

## 2011-09-18 DIAGNOSIS — Z0130 Encounter for examination of blood pressure without abnormal findings: Secondary | ICD-10-CM

## 2011-09-19 ENCOUNTER — Inpatient Hospital Stay: Payer: Self-pay | Admitting: Student

## 2011-09-19 ENCOUNTER — Ambulatory Visit (INDEPENDENT_AMBULATORY_CARE_PROVIDER_SITE_OTHER): Payer: Medicare Other | Admitting: Internal Medicine

## 2011-09-19 ENCOUNTER — Encounter: Payer: Self-pay | Admitting: Internal Medicine

## 2011-09-19 VITALS — BP 90/60 | HR 90 | Temp 100.6°F | Resp 14 | Wt 129.2 lb

## 2011-09-19 DIAGNOSIS — R112 Nausea with vomiting, unspecified: Secondary | ICD-10-CM | POA: Diagnosis not present

## 2011-09-19 DIAGNOSIS — E46 Unspecified protein-calorie malnutrition: Secondary | ICD-10-CM | POA: Diagnosis present

## 2011-09-19 DIAGNOSIS — N9489 Other specified conditions associated with female genital organs and menstrual cycle: Secondary | ICD-10-CM | POA: Diagnosis not present

## 2011-09-19 DIAGNOSIS — R1110 Vomiting, unspecified: Secondary | ICD-10-CM | POA: Diagnosis not present

## 2011-09-19 DIAGNOSIS — M7989 Other specified soft tissue disorders: Secondary | ICD-10-CM | POA: Diagnosis present

## 2011-09-19 DIAGNOSIS — R109 Unspecified abdominal pain: Secondary | ICD-10-CM | POA: Diagnosis not present

## 2011-09-19 DIAGNOSIS — IMO0002 Reserved for concepts with insufficient information to code with codable children: Secondary | ICD-10-CM | POA: Diagnosis not present

## 2011-09-19 DIAGNOSIS — K5100 Ulcerative (chronic) pancolitis without complications: Secondary | ICD-10-CM | POA: Diagnosis not present

## 2011-09-19 DIAGNOSIS — I10 Essential (primary) hypertension: Secondary | ICD-10-CM | POA: Diagnosis present

## 2011-09-19 DIAGNOSIS — H9190 Unspecified hearing loss, unspecified ear: Secondary | ICD-10-CM | POA: Diagnosis present

## 2011-09-19 DIAGNOSIS — K5939 Other megacolon: Secondary | ICD-10-CM | POA: Diagnosis not present

## 2011-09-19 DIAGNOSIS — D649 Anemia, unspecified: Secondary | ICD-10-CM | POA: Diagnosis present

## 2011-09-19 DIAGNOSIS — K648 Other hemorrhoids: Secondary | ICD-10-CM | POA: Diagnosis present

## 2011-09-19 DIAGNOSIS — I9589 Other hypotension: Secondary | ICD-10-CM | POA: Diagnosis present

## 2011-09-19 DIAGNOSIS — E876 Hypokalemia: Secondary | ICD-10-CM | POA: Diagnosis not present

## 2011-09-19 DIAGNOSIS — K5989 Other specified functional intestinal disorders: Secondary | ICD-10-CM | POA: Diagnosis not present

## 2011-09-19 DIAGNOSIS — R1084 Generalized abdominal pain: Secondary | ICD-10-CM | POA: Diagnosis not present

## 2011-09-19 DIAGNOSIS — R197 Diarrhea, unspecified: Secondary | ICD-10-CM | POA: Diagnosis not present

## 2011-09-19 DIAGNOSIS — R141 Gas pain: Secondary | ICD-10-CM | POA: Diagnosis not present

## 2011-09-19 DIAGNOSIS — K5289 Other specified noninfective gastroenteritis and colitis: Secondary | ICD-10-CM | POA: Diagnosis not present

## 2011-09-19 DIAGNOSIS — I959 Hypotension, unspecified: Secondary | ICD-10-CM | POA: Diagnosis not present

## 2011-09-19 DIAGNOSIS — M353 Polymyalgia rheumatica: Secondary | ICD-10-CM | POA: Diagnosis present

## 2011-09-19 DIAGNOSIS — E785 Hyperlipidemia, unspecified: Secondary | ICD-10-CM | POA: Diagnosis not present

## 2011-09-19 DIAGNOSIS — K560 Paralytic ileus: Secondary | ICD-10-CM | POA: Diagnosis not present

## 2011-09-19 DIAGNOSIS — M25539 Pain in unspecified wrist: Secondary | ICD-10-CM | POA: Diagnosis not present

## 2011-09-19 DIAGNOSIS — E871 Hypo-osmolality and hyponatremia: Secondary | ICD-10-CM | POA: Diagnosis not present

## 2011-09-19 DIAGNOSIS — A0472 Enterocolitis due to Clostridium difficile, not specified as recurrent: Secondary | ICD-10-CM | POA: Diagnosis not present

## 2011-09-19 DIAGNOSIS — E860 Dehydration: Secondary | ICD-10-CM | POA: Diagnosis not present

## 2011-09-19 DIAGNOSIS — M79609 Pain in unspecified limb: Secondary | ICD-10-CM | POA: Diagnosis present

## 2011-09-19 LAB — URINALYSIS, COMPLETE
Bacteria: NONE SEEN
Bilirubin,UR: NEGATIVE
Glucose,UR: NEGATIVE mg/dL (ref 0–75)
Leukocyte Esterase: NEGATIVE
Nitrite: NEGATIVE
Ph: 5 (ref 4.5–8.0)
Protein: 30
RBC,UR: 22 /HPF (ref 0–5)
Specific Gravity: 1.021 (ref 1.003–1.030)
Squamous Epithelial: 1
WBC UR: 3 /HPF (ref 0–5)

## 2011-09-19 LAB — CBC
HCT: 34.4 % — ABNORMAL LOW (ref 35.0–47.0)
HGB: 11.6 g/dL — ABNORMAL LOW (ref 12.0–16.0)
MCH: 31.3 pg (ref 26.0–34.0)
MCHC: 33.8 g/dL (ref 32.0–36.0)
MCV: 93 fL (ref 80–100)
Platelet: 512 10*3/uL — ABNORMAL HIGH (ref 150–440)
RBC: 3.72 10*6/uL — ABNORMAL LOW (ref 3.80–5.20)
RDW: 12.9 % (ref 11.5–14.5)
WBC: 6.8 10*3/uL (ref 3.6–11.0)

## 2011-09-19 LAB — COMPREHENSIVE METABOLIC PANEL
Albumin: 1.8 g/dL — ABNORMAL LOW (ref 3.4–5.0)
Alkaline Phosphatase: 48 U/L — ABNORMAL LOW (ref 50–136)
Anion Gap: 10 (ref 7–16)
BUN: 13 mg/dL (ref 7–18)
Bilirubin,Total: 0.5 mg/dL (ref 0.2–1.0)
Calcium, Total: 7.7 mg/dL — ABNORMAL LOW (ref 8.5–10.1)
Chloride: 96 mmol/L — ABNORMAL LOW (ref 98–107)
Co2: 25 mmol/L (ref 21–32)
Creatinine: 0.71 mg/dL (ref 0.60–1.30)
EGFR (African American): >60
EGFR (Non-African Amer.): >60
Glucose: 107 mg/dL — ABNORMAL HIGH (ref 65–99)
Osmolality: 263 (ref 275–301)
Potassium: 3.9 mmol/L (ref 3.5–5.1)
SGOT(AST): 27 U/L (ref 15–37)
SGPT (ALT): 12 U/L
Sodium: 131 mmol/L — ABNORMAL LOW (ref 136–145)
Total Protein: 5.5 g/dL — ABNORMAL LOW (ref 6.4–8.2)

## 2011-09-19 LAB — LIPASE, BLOOD: Lipase: 40 U/L — ABNORMAL LOW (ref 73–393)

## 2011-09-19 NOTE — Progress Notes (Signed)
Patient ID: Kaitlyn Roman, female   DOB: 10/31/1945, 66 y.o.   MRN: [id number]  Patient Active Problem List  Diagnoses  . Hyperlipidemia  . Hypertension  . OA (osteoarthritis)  . Polymyalgia rheumatica  . URI (upper respiratory infection)  . Neuropathy  . Tracheitis  . Nausea and vomiting in adult    Subjective:  CC:   Chief Complaint  Patient presents with  . Diarrhea  . Dehydration    HPI:   Kaitlyn Roman a 66 y.o. female who presents  with persistent vomiting and diarrhea for the past 10 days .  She was treated with augmentin 3 weeks ago for sinusitis which resolved.  Symptoms [place] the beach.  She was was treated in ER  [facility] On April 21 and 22 with IV fluids, stool cultures sent for C dificile and sent home with flagyl and bentyl but symptoms did not improve and she was seen again the following day. She received IV abx,  flagyl was stopped bc the  Test for c dificile was negative. Was told to continue bentyl,  Added  zofran and omeprazole. Yesterday was able to drink one quart of G2 but then threw up.  Stools have been black, liquid, multiple daily.  Has not been able to keep down any solid food for 5 days and over the last 12 hours has been unable to keep down water without dry heaving.  Developed delirium 2 days ago per husband.    Past Medical History  Diagnosis Date  . Polymyalgia rheumatica syndrome     rheumatoloigst Dr Freida Busman at [facility]  . Hyperlipidemia   . Hypertension     History reviewed. No pertinent past surgical history.       The following portions of the patient's history were reviewed and updated as appropriate: Allergies, current medications, and problem list.    Review of Systems:   12 Pt  review of systems was negative except those addressed in the HPI,     History   Social History  . Marital Status: Married    Spouse Name: N/A    Number of Children: N/A  . Years of Education: N/A    Occupational History  . Not on file.   Social History Main Topics  . Smoking status: Never Smoker   . Smokeless tobacco: Never Used  . Alcohol Use: Yes     one cocctail nightly   . Drug Use: No  . Sexually Active: Not on file   Other Topics Concern  . Not on file   Social History Narrative  . No narrative on file    Objective:  BP 102/62  Pulse 90  Temp(Src) 100.6 F (38.1 C) (Oral)  Resp 14  Wt 129 lb 4 oz (58.627 kg)  SpO2 99%  General appearance: alert, cooperative and appears stated age Ears: normal TM's and external ear canals both ears Throat: lips, mucosa, and tongue is beefy red,  Neck: no adenopathy, no carotid bruit, supple, symmetrical, trachea midline and thyroid not enlarged, symmetric, no tenderness/mass/nodules Back: symmetric, no curvature. ROM normal. No CVA tenderness. Lungs: clear to auscultation bilaterally Heart: regular rate and rhythm, S1, S2 normal, no murmur, click, rub or gallop Abdomen: distended, tender  High pitched bowel sounds Pulses: 2+ and symmetric Skin: Skin color, texture, turgor normal. No rashes or lesions Lymph nodes: Cervical, supraclavicular, and axillary nodes normal.  Assessment and Plan:  Nausea and vomiting in adult Symptoms have been present for 10 days  and progressing, now with delirium, recurrent dehydration, abdomnal distension high pitched bowel sounds, and profuse diarrhea concerning for SBO. [name]  Unfortunately the hospitalist on call would not accept patient as direct admit so will send patient to ER for emergent imaging and stabilization.       Updated Medication List Outpatient Encounter Prescriptions as of 09/19/2011  Medication Sig Dispense Refill  . aspirin 81 MG tablet Take 81 mg by mouth daily.      [name] atorvastatin (LIPITOR) 40 MG tablet Take 20 mg by mouth daily.      [name] dicyclomine (BENTYL) 20 MG tablet Take 20 mg by mouth every 6 (six) hours.      [name] estradiol (ESTRACE) 0.5 MG tablet Take 1 tablet (0.5 mg  total) by mouth daily.  30 tablet  11  . fluocinonide ointment (LIDEX) 0.05 % Apply 1 application topically 2 (two) times daily.  30 g  11  . losartan (COZAAR) 100 MG tablet Take 1 tablet (100 mg total) by mouth daily.  30 tablet  4  . meloxicam (MOBIC) 15 MG tablet Take 1 tablet (15 mg total) by mouth daily.  30 tablet  4  . ondansetron (ZOFRAN) 4 MG tablet Take 4 mg by mouth every 8 (eight) hours as needed.      . traMADol (ULTRAM) 50 MG tablet Take 50 mg by mouth every 6 (six) hours as needed.        [name] DISCONTD: guaiFENesin-codeine (CHERATUSSIN AC) 100-10 MG/5ML syrup 1 tablespoon every 6 hours as needed for cough.  120 mL  0     No orders of the defined types were placed in this encounter.    No Follow-up on file.

## 2011-09-19 NOTE — Assessment & Plan Note (Signed)
Symptoms have been present for 10 days and progressing, now with delirium, recurrent dehydration, abdomnal distension high pitched bowel sounds, and profuse diarrhea concerning for SBO. Kaitlyn Roman  Unfortunately the hospitalist on call would not accept patient as direct admit so will send patient to ER ,

## 2011-09-20 LAB — CBC WITH DIFFERENTIAL/PLATELET
Basophil #: 0 10*3/uL (ref 0.0–0.1)
Basophil %: 0.5 %
Eosinophil #: 0 10*3/uL (ref 0.0–0.7)
Eosinophil %: 0.4 %
HCT: 29.3 % — ABNORMAL LOW (ref 35.0–47.0)
HGB: 9.9 g/dL — ABNORMAL LOW (ref 12.0–16.0)
Lymphocyte #: 0.4 10*3/uL — ABNORMAL LOW (ref 1.0–3.6)
Lymphocyte %: 8.2 %
MCH: 31.4 pg (ref 26.0–34.0)
MCHC: 33.8 g/dL (ref 32.0–36.0)
MCV: 93 fL (ref 80–100)
Monocyte #: 0.5 x10 3/mm (ref 0.2–0.9)
Monocyte %: 9 %
Neutrophil #: 4.3 10*3/uL (ref 1.4–6.5)
Neutrophil %: 81.9 %
Platelet: 449 10*3/uL — ABNORMAL HIGH (ref 150–440)
RBC: 3.16 10*6/uL — ABNORMAL LOW (ref 3.80–5.20)
RDW: 13 % (ref 11.5–14.5)
WBC: 5.3 10*3/uL (ref 3.6–11.0)

## 2011-09-20 LAB — BASIC METABOLIC PANEL
Anion Gap: 12 (ref 7–16)
BUN: 9 mg/dL (ref 7–18)
Calcium, Total: 6.9 mg/dL — CL (ref 8.5–10.1)
Chloride: 101 mmol/L (ref 98–107)
Co2: 21 mmol/L (ref 21–32)
Creatinine: 0.56 mg/dL — ABNORMAL LOW (ref 0.60–1.30)
EGFR (African American): >60
EGFR (Non-African Amer.): >60
Glucose: 80 mg/dL (ref 65–99)
Osmolality: 266 (ref 275–301)
Potassium: 3.5 mmol/L (ref 3.5–5.1)
Sodium: 134 mmol/L — ABNORMAL LOW (ref 136–145)

## 2011-09-20 LAB — CLOSTRIDIUM DIFFICILE BY PCR

## 2011-09-21 LAB — BASIC METABOLIC PANEL
Anion Gap: 10 (ref 7–16)
BUN: 7 mg/dL (ref 7–18)
Calcium, Total: 7.2 mg/dL — ABNORMAL LOW (ref 8.5–10.1)
Chloride: 104 mmol/L (ref 98–107)
Co2: 22 mmol/L (ref 21–32)
Creatinine: 0.66 mg/dL (ref 0.60–1.30)
EGFR (African American): >60
EGFR (Non-African Amer.): >60
Glucose: 96 mg/dL (ref 65–99)
Osmolality: 270 (ref 275–301)
Potassium: 3.5 mmol/L (ref 3.5–5.1)
Sodium: 136 mmol/L (ref 136–145)

## 2011-09-21 LAB — MAGNESIUM: Magnesium: 1.3 mg/dL — ABNORMAL LOW

## 2011-09-21 LAB — CBC WITH DIFFERENTIAL/PLATELET
Basophil #: 0 10*3/uL (ref 0.0–0.1)
Basophil %: 0.4 %
Eosinophil #: 0 10*3/uL (ref 0.0–0.7)
Eosinophil %: 0.6 %
HCT: 31.5 % — ABNORMAL LOW (ref 35.0–47.0)
HGB: 10.6 g/dL — ABNORMAL LOW (ref 12.0–16.0)
Lymphocyte #: 0.4 10*3/uL — ABNORMAL LOW (ref 1.0–3.6)
Lymphocyte %: 8.5 %
MCH: 31.3 pg (ref 26.0–34.0)
MCHC: 33.7 g/dL (ref 32.0–36.0)
MCV: 93 fL (ref 80–100)
Monocyte #: 0.5 x10 3/mm (ref 0.2–0.9)
Monocyte %: 8.8 %
Neutrophil #: 4.2 10*3/uL (ref 1.4–6.5)
Neutrophil %: 81.7 %
Platelet: 483 10*3/uL — ABNORMAL HIGH (ref 150–440)
RBC: 3.38 10*6/uL — ABNORMAL LOW (ref 3.80–5.20)
RDW: 13 % (ref 11.5–14.5)
WBC: 5.2 10*3/uL (ref 3.6–11.0)

## 2011-09-21 LAB — WBCS, STOOL

## 2011-09-22 LAB — BASIC METABOLIC PANEL
Anion Gap: 9 (ref 7–16)
BUN: 5 mg/dL — ABNORMAL LOW (ref 7–18)
Calcium, Total: 7.3 mg/dL — ABNORMAL LOW (ref 8.5–10.1)
Chloride: 103 mmol/L (ref 98–107)
Co2: 24 mmol/L (ref 21–32)
Creatinine: 0.57 mg/dL — ABNORMAL LOW (ref 0.60–1.30)
EGFR (African American): >60
EGFR (Non-African Amer.): >60
Glucose: 100 mg/dL — ABNORMAL HIGH (ref 65–99)
Osmolality: 269 (ref 275–301)
Potassium: 3.5 mmol/L (ref 3.5–5.1)
Sodium: 136 mmol/L (ref 136–145)

## 2011-09-22 LAB — MAGNESIUM: Magnesium: 1.6 mg/dL — ABNORMAL LOW

## 2011-09-22 LAB — STOOL CULTURE

## 2011-09-23 LAB — CBC WITH DIFFERENTIAL/PLATELET
Basophil #: 0 10*3/uL (ref 0.0–0.1)
Basophil %: 0.4 %
Eosinophil #: 0 10*3/uL (ref 0.0–0.7)
Eosinophil %: 0.8 %
HCT: 32 % — ABNORMAL LOW (ref 35.0–47.0)
HGB: 10.8 g/dL — ABNORMAL LOW (ref 12.0–16.0)
Lymphocyte #: 0.6 10*3/uL — ABNORMAL LOW (ref 1.0–3.6)
Lymphocyte %: 12 %
MCH: 31.3 pg (ref 26.0–34.0)
MCHC: 33.8 g/dL (ref 32.0–36.0)
MCV: 93 fL (ref 80–100)
Monocyte #: 0.4 x10 3/mm (ref 0.2–0.9)
Monocyte %: 8 %
Neutrophil #: 4.1 10*3/uL (ref 1.4–6.5)
Neutrophil %: 78.8 %
Platelet: 507 10*3/uL — ABNORMAL HIGH (ref 150–440)
RBC: 3.45 10*6/uL — ABNORMAL LOW (ref 3.80–5.20)
RDW: 13.3 % (ref 11.5–14.5)
WBC: 5.1 10*3/uL (ref 3.6–11.0)

## 2011-09-23 LAB — BASIC METABOLIC PANEL
Anion Gap: 9 (ref 7–16)
BUN: 3 mg/dL — ABNORMAL LOW (ref 7–18)
Calcium, Total: 7.2 mg/dL — ABNORMAL LOW (ref 8.5–10.1)
Chloride: 106 mmol/L (ref 98–107)
Co2: 23 mmol/L (ref 21–32)
Creatinine: 0.53 mg/dL — ABNORMAL LOW (ref 0.60–1.30)
EGFR (African American): >60
EGFR (Non-African Amer.): >60
Glucose: 97 mg/dL (ref 65–99)
Osmolality: 272 (ref 275–301)
Potassium: 3.8 mmol/L (ref 3.5–5.1)
Sodium: 138 mmol/L (ref 136–145)

## 2011-09-23 LAB — MAGNESIUM: Magnesium: 1.7 mg/dL — ABNORMAL LOW

## 2011-09-24 ENCOUNTER — Telehealth: Payer: Self-pay | Admitting: Internal Medicine

## 2011-09-24 LAB — BASIC METABOLIC PANEL
Anion Gap: 8 (ref 7–16)
BUN: 3 mg/dL — ABNORMAL LOW (ref 7–18)
Calcium, Total: 7.3 mg/dL — ABNORMAL LOW (ref 8.5–10.1)
Chloride: 106 mmol/L (ref 98–107)
Co2: 24 mmol/L (ref 21–32)
Creatinine: 0.47 mg/dL — ABNORMAL LOW (ref 0.60–1.30)
EGFR (African American): >60
EGFR (Non-African Amer.): >60
Glucose: 95 mg/dL (ref 65–99)
Osmolality: 272 (ref 275–301)
Potassium: 3.8 mmol/L (ref 3.5–5.1)
Sodium: 138 mmol/L (ref 136–145)

## 2011-09-24 LAB — CBC WITH DIFFERENTIAL/PLATELET
Basophil #: 0 10*3/uL (ref 0.0–0.1)
Basophil %: 0.6 %
Eosinophil #: 0 10*3/uL (ref 0.0–0.7)
Eosinophil %: 0.7 %
HCT: 32.1 % — ABNORMAL LOW (ref 35.0–47.0)
HGB: 10.8 g/dL — ABNORMAL LOW (ref 12.0–16.0)
Lymphocyte #: 0.8 10*3/uL — ABNORMAL LOW (ref 1.0–3.6)
Lymphocyte %: 14.7 %
MCH: 31.5 pg (ref 26.0–34.0)
MCHC: 33.7 g/dL (ref 32.0–36.0)
MCV: 94 fL (ref 80–100)
Monocyte #: 0.4 x10 3/mm (ref 0.2–0.9)
Monocyte %: 6.7 %
Neutrophil #: 4.3 10*3/uL (ref 1.4–6.5)
Neutrophil %: 77.3 %
Platelet: 497 10*3/uL — ABNORMAL HIGH (ref 150–440)
RBC: 3.43 10*6/uL — ABNORMAL LOW (ref 3.80–5.20)
RDW: 13 % (ref 11.5–14.5)
WBC: 5.5 10*3/uL (ref 3.6–11.0)

## 2011-09-24 LAB — CLOSTRIDIUM DIFFICILE BY PCR

## 2011-09-24 NOTE — Telephone Encounter (Signed)
Clyde ms Schoeller spouse call to see if you could fax lab done at [facility]  They are looking for the c-diff.  He stated you faxed this to the er now they need this to go to the dr on the floor that she is on Dr patel    Fax [phone] Please let clyde know when these are fax

## 2011-09-24 NOTE — Telephone Encounter (Signed)
I called Kaitlyn Roman and let him know we do not have the records from [facility] anymore, that they have already been sent to be scanned.  I advised he call the ER where I faxed them last week to see if they still have the records.

## 2011-09-25 LAB — BASIC METABOLIC PANEL
Anion Gap: 10 (ref 7–16)
BUN: 4 mg/dL — ABNORMAL LOW (ref 7–18)
Calcium, Total: 7.7 mg/dL — ABNORMAL LOW (ref 8.5–10.1)
Chloride: 106 mmol/L (ref 98–107)
Co2: 22 mmol/L (ref 21–32)
Creatinine: 0.44 mg/dL — ABNORMAL LOW (ref 0.60–1.30)
EGFR (African American): >60
EGFR (Non-African Amer.): >60
Glucose: 114 mg/dL — ABNORMAL HIGH (ref 65–99)
Osmolality: 273 (ref 275–301)
Potassium: 3.8 mmol/L (ref 3.5–5.1)
Sodium: 138 mmol/L (ref 136–145)

## 2011-09-25 LAB — CULTURE, BLOOD (SINGLE)

## 2011-09-26 ENCOUNTER — Telehealth: Payer: Self-pay | Admitting: Internal Medicine

## 2011-09-26 LAB — BASIC METABOLIC PANEL
Anion Gap: 7 (ref 7–16)
BUN: 5 mg/dL — ABNORMAL LOW (ref 7–18)
Calcium, Total: 7.3 mg/dL — ABNORMAL LOW (ref 8.5–10.1)
Chloride: 106 mmol/L (ref 98–107)
Co2: 28 mmol/L (ref 21–32)
Creatinine: 0.58 mg/dL — ABNORMAL LOW (ref 0.60–1.30)
EGFR (African American): >60
EGFR (Non-African Amer.): >60
Glucose: 73 mg/dL (ref 65–99)
Osmolality: 277 (ref 275–301)
Potassium: 3.1 mmol/L — ABNORMAL LOW (ref 3.5–5.1)
Sodium: 141 mmol/L (ref 136–145)

## 2011-09-26 NOTE — Telephone Encounter (Signed)
[phone] Kaitlyn Roman would like for you to call him about Kaitlyn Roman.  cdiff is now classified as serious infection  No progress since in hospital

## 2011-09-27 LAB — CBC WITH DIFFERENTIAL/PLATELET
Basophil #: 0.1 10*3/uL (ref 0.0–0.1)
Basophil %: 1 %
Eosinophil #: 0 10*3/uL (ref 0.0–0.7)
Eosinophil %: 0.3 %
HCT: 31.4 % — ABNORMAL LOW (ref 35.0–47.0)
HGB: 10.7 g/dL — ABNORMAL LOW (ref 12.0–16.0)
Lymphocyte #: 1.1 10*3/uL (ref 1.0–3.6)
Lymphocyte %: 20.7 %
MCH: 31.6 pg (ref 26.0–34.0)
MCHC: 34.2 g/dL (ref 32.0–36.0)
MCV: 92 fL (ref 80–100)
Monocyte #: 0.4 x10 3/mm (ref 0.2–0.9)
Monocyte %: 8.3 %
Neutrophil #: 3.7 10*3/uL (ref 1.4–6.5)
Neutrophil %: 69.7 %
Platelet: 515 10*3/uL — ABNORMAL HIGH (ref 150–440)
RBC: 3.4 10*6/uL — ABNORMAL LOW (ref 3.80–5.20)
RDW: 13.5 % (ref 11.5–14.5)
WBC: 5.3 10*3/uL (ref 3.6–11.0)

## 2011-09-27 LAB — BASIC METABOLIC PANEL
Anion Gap: 7 (ref 7–16)
BUN: 2 mg/dL — ABNORMAL LOW (ref 7–18)
Calcium, Total: 7.3 mg/dL — ABNORMAL LOW (ref 8.5–10.1)
Chloride: 107 mmol/L (ref 98–107)
Co2: 27 mmol/L (ref 21–32)
Creatinine: 0.47 mg/dL — ABNORMAL LOW (ref 0.60–1.30)
EGFR (African American): >60
EGFR (Non-African Amer.): >60
Glucose: 84 mg/dL (ref 65–99)
Osmolality: 277 (ref 275–301)
Potassium: 3.3 mmol/L — ABNORMAL LOW (ref 3.5–5.1)
Sodium: 141 mmol/L (ref 136–145)

## 2011-09-27 NOTE — Telephone Encounter (Signed)
Left message asking patient to return my call.

## 2011-09-28 LAB — BASIC METABOLIC PANEL
Anion Gap: 8 (ref 7–16)
BUN: 1 mg/dL — ABNORMAL LOW (ref 7–18)
Calcium, Total: 7.7 mg/dL — ABNORMAL LOW (ref 8.5–10.1)
Chloride: 107 mmol/L (ref 98–107)
Co2: 26 mmol/L (ref 21–32)
Creatinine: 0.49 mg/dL — ABNORMAL LOW (ref 0.60–1.30)
EGFR (African American): >60
EGFR (Non-African Amer.): >60
Glucose: 81 mg/dL (ref 65–99)
Osmolality: 276 (ref 275–301)
Potassium: 3.6 mmol/L (ref 3.5–5.1)
Sodium: 141 mmol/L (ref 136–145)

## 2011-09-30 LAB — CBC WITH DIFFERENTIAL/PLATELET
Basophil #: 0 10*3/uL (ref 0.0–0.1)
Basophil %: 1.4 %
Eosinophil #: 0 10*3/uL (ref 0.0–0.7)
Eosinophil %: 0.9 %
HCT: 33.1 % — ABNORMAL LOW (ref 35.0–47.0)
HGB: 11.3 g/dL — ABNORMAL LOW (ref 12.0–16.0)
Lymphocyte #: 1.4 10*3/uL (ref 1.0–3.6)
Lymphocyte %: 37.1 %
MCH: 31.4 pg (ref 26.0–34.0)
MCHC: 34.1 g/dL (ref 32.0–36.0)
MCV: 92 fL (ref 80–100)
Monocyte #: 0.4 x10 3/mm (ref 0.2–0.9)
Monocyte %: 11 %
Neutrophil #: 1.8 10*3/uL (ref 1.4–6.5)
Neutrophil %: 49.6 %
Platelet: 410 10*3/uL (ref 150–440)
RBC: 3.59 10*6/uL — ABNORMAL LOW (ref 3.80–5.20)
RDW: 13.7 % (ref 11.5–14.5)
WBC: 3.7 10*3/uL (ref 3.6–11.0)

## 2011-09-30 LAB — BASIC METABOLIC PANEL
Anion Gap: 7 (ref 7–16)
BUN: 2 mg/dL — ABNORMAL LOW (ref 7–18)
Calcium, Total: 7.6 mg/dL — ABNORMAL LOW (ref 8.5–10.1)
Chloride: 105 mmol/L (ref 98–107)
Co2: 28 mmol/L (ref 21–32)
Creatinine: 0.46 mg/dL — ABNORMAL LOW (ref 0.60–1.30)
EGFR (African American): >60
EGFR (Non-African Amer.): >60
Glucose: 85 mg/dL (ref 65–99)
Osmolality: 275 (ref 275–301)
Potassium: 3 mmol/L — ABNORMAL LOW (ref 3.5–5.1)
Sodium: 140 mmol/L (ref 136–145)

## 2011-10-01 LAB — CBC WITH DIFFERENTIAL/PLATELET
Basophil #: 0 10*3/uL (ref 0.0–0.1)
Basophil %: 1.4 %
Eosinophil #: 0 10*3/uL (ref 0.0–0.7)
Eosinophil %: 0.9 %
HCT: 32.6 % — ABNORMAL LOW (ref 35.0–47.0)
HGB: 11 g/dL — ABNORMAL LOW (ref 12.0–16.0)
Lymphocyte #: 1.4 10*3/uL (ref 1.0–3.6)
Lymphocyte %: 42.9 %
MCH: 31.5 pg (ref 26.0–34.0)
MCHC: 33.8 g/dL (ref 32.0–36.0)
MCV: 93 fL (ref 80–100)
Monocyte #: 0.4 x10 3/mm (ref 0.2–0.9)
Monocyte %: 11.9 %
Neutrophil #: 1.4 10*3/uL (ref 1.4–6.5)
Neutrophil %: 42.9 %
Platelet: 364 10*3/uL (ref 150–440)
RBC: 3.5 10*6/uL — ABNORMAL LOW (ref 3.80–5.20)
RDW: 14 % (ref 11.5–14.5)
WBC: 3.3 10*3/uL — ABNORMAL LOW (ref 3.6–11.0)

## 2011-10-01 LAB — BASIC METABOLIC PANEL
Anion Gap: 6 — ABNORMAL LOW (ref 7–16)
BUN: 3 mg/dL — ABNORMAL LOW (ref 7–18)
Calcium, Total: 7.9 mg/dL — ABNORMAL LOW (ref 8.5–10.1)
Chloride: 107 mmol/L (ref 98–107)
Co2: 28 mmol/L (ref 21–32)
Creatinine: 0.48 mg/dL — ABNORMAL LOW (ref 0.60–1.30)
EGFR (African American): >60
EGFR (Non-African Amer.): >60
Glucose: 79 mg/dL (ref 65–99)
Osmolality: 277 (ref 275–301)
Potassium: 4 mmol/L (ref 3.5–5.1)
Sodium: 141 mmol/L (ref 136–145)

## 2011-10-01 LAB — CLOSTRIDIUM DIFFICILE BY PCR

## 2011-10-01 NOTE — Telephone Encounter (Signed)
Left another message asking husband to call

## 2011-10-02 LAB — PROT IMMUNOELECTROPHORES(ARMC)

## 2011-10-03 LAB — CBC WITH DIFFERENTIAL/PLATELET
Basophil #: 0.1 10*3/uL (ref 0.0–0.1)
Basophil %: 1.3 %
Eosinophil #: 0 10*3/uL (ref 0.0–0.7)
Eosinophil %: 0.5 %
HCT: 32.1 % — ABNORMAL LOW (ref 35.0–47.0)
HGB: 10.8 g/dL — ABNORMAL LOW (ref 12.0–16.0)
Lymphocyte #: 2.4 10*3/uL (ref 1.0–3.6)
Lymphocyte %: 49.7 %
MCH: 31.2 pg (ref 26.0–34.0)
MCHC: 33.7 g/dL (ref 32.0–36.0)
MCV: 93 fL (ref 80–100)
Monocyte #: 0.5 x10 3/mm (ref 0.2–0.9)
Monocyte %: 11 %
Neutrophil #: 1.8 10*3/uL (ref 1.4–6.5)
Neutrophil %: 37.5 %
Platelet: 325 10*3/uL (ref 150–440)
RBC: 3.46 10*6/uL — ABNORMAL LOW (ref 3.80–5.20)
RDW: 14 % (ref 11.5–14.5)
WBC: 4.8 10*3/uL (ref 3.6–11.0)

## 2011-10-04 LAB — BASIC METABOLIC PANEL
Anion Gap: 8 (ref 7–16)
BUN: 5 mg/dL — ABNORMAL LOW (ref 7–18)
Calcium, Total: 7.8 mg/dL — ABNORMAL LOW (ref 8.5–10.1)
Chloride: 105 mmol/L (ref 98–107)
Co2: 27 mmol/L (ref 21–32)
Creatinine: 0.47 mg/dL — ABNORMAL LOW (ref 0.60–1.30)
EGFR (African American): >60
EGFR (Non-African Amer.): >60
Glucose: 82 mg/dL (ref 65–99)
Osmolality: 276 (ref 275–301)
Potassium: 2.8 mmol/L — ABNORMAL LOW (ref 3.5–5.1)
Sodium: 140 mmol/L (ref 136–145)

## 2011-10-04 NOTE — Telephone Encounter (Signed)
I called patients husband he did not receive any messages from me or Morrie Sheldon but he was appreciative that we did call to check on patient.  He stated she is still in the hospital and wanted Dr. Darrick Huntsman to be aware.  Dr. Darrick Huntsman notified.

## 2011-10-05 LAB — BASIC METABOLIC PANEL
Anion Gap: 9 (ref 7–16)
BUN: 6 mg/dL — ABNORMAL LOW (ref 7–18)
Calcium, Total: 7.8 mg/dL — ABNORMAL LOW (ref 8.5–10.1)
Chloride: 105 mmol/L (ref 98–107)
Co2: 26 mmol/L (ref 21–32)
Creatinine: 0.44 mg/dL — ABNORMAL LOW (ref 0.60–1.30)
EGFR (African American): >60
EGFR (Non-African Amer.): >60
Glucose: 78 mg/dL (ref 65–99)
Osmolality: 276 (ref 275–301)
Potassium: 3 mmol/L — ABNORMAL LOW (ref 3.5–5.1)
Sodium: 140 mmol/L (ref 136–145)

## 2011-10-05 LAB — SEDIMENTATION RATE: Erythrocyte Sed Rate: 48 mm/hr — ABNORMAL HIGH (ref 0–30)

## 2011-10-05 LAB — URIC ACID: Uric Acid: 1.7 mg/dL — ABNORMAL LOW (ref 2.6–6.0)

## 2011-10-06 LAB — CLOSTRIDIUM DIFFICILE BY PCR

## 2011-10-08 LAB — CBC WITH DIFFERENTIAL/PLATELET
Bands: 4 %
Comment - H1-Com1: NORMAL
Comment - H1-Com2: NORMAL
HCT: 33.6 % — ABNORMAL LOW (ref 35.0–47.0)
HGB: 11.4 g/dL — ABNORMAL LOW (ref 12.0–16.0)
Lymphocytes: 31 %
MCH: 30.9 pg (ref 26.0–34.0)
MCHC: 34 g/dL (ref 32.0–36.0)
MCV: 91 fL (ref 80–100)
Monocytes: 6 %
Platelet: 385 10*3/uL (ref 150–440)
RBC: 3.69 10*6/uL — ABNORMAL LOW (ref 3.80–5.20)
RDW: 14.1 % (ref 11.5–14.5)
Segmented Neutrophils: 21 %
Variant Lymphocyte - H1-Rlymph: 38 %
WBC: 5.2 10*3/uL (ref 3.6–11.0)

## 2011-10-08 LAB — BASIC METABOLIC PANEL
Anion Gap: 8 (ref 7–16)
BUN: 6 mg/dL — ABNORMAL LOW (ref 7–18)
Calcium, Total: 8.7 mg/dL (ref 8.5–10.1)
Chloride: 108 mmol/L — ABNORMAL HIGH (ref 98–107)
Co2: 27 mmol/L (ref 21–32)
Creatinine: 0.5 mg/dL — ABNORMAL LOW (ref 0.60–1.30)
EGFR (African American): >60
EGFR (Non-African Amer.): >60
Glucose: 81 mg/dL (ref 65–99)
Osmolality: 282 (ref 275–301)
Potassium: 3.8 mmol/L (ref 3.5–5.1)
Sodium: 143 mmol/L (ref 136–145)

## 2011-10-09 LAB — PATHOLOGY REPORT

## 2011-10-09 LAB — STOOL CULTURE

## 2011-10-12 DIAGNOSIS — R197 Diarrhea, unspecified: Secondary | ICD-10-CM | POA: Diagnosis not present

## 2011-10-16 DIAGNOSIS — A0472 Enterocolitis due to Clostridium difficile, not specified as recurrent: Secondary | ICD-10-CM | POA: Diagnosis not present

## 2011-10-24 ENCOUNTER — Encounter: Payer: Self-pay | Admitting: Internal Medicine

## 2011-11-12 DIAGNOSIS — Z961 Presence of intraocular lens: Secondary | ICD-10-CM | POA: Diagnosis not present

## 2011-11-12 DIAGNOSIS — H2510 Age-related nuclear cataract, unspecified eye: Secondary | ICD-10-CM | POA: Diagnosis not present

## 2011-11-12 DIAGNOSIS — H269 Unspecified cataract: Secondary | ICD-10-CM | POA: Diagnosis not present

## 2011-12-04 DIAGNOSIS — A09 Infectious gastroenteritis and colitis, unspecified: Secondary | ICD-10-CM | POA: Diagnosis not present

## 2011-12-10 ENCOUNTER — Other Ambulatory Visit: Payer: Self-pay | Admitting: *Deleted

## 2011-12-10 MED ORDER — LOSARTAN POTASSIUM 100 MG PO TABS: 100.0000 mg | ORAL_TABLET | Freq: Every day | ORAL | Status: DC

## 2011-12-10 MED ORDER — ATORVASTATIN CALCIUM 40 MG PO TABS: 20.0000 mg | ORAL_TABLET | Freq: Every day | ORAL | Status: DC

## 2011-12-10 NOTE — Telephone Encounter (Signed)
Rx sent in

## 2012-01-14 ENCOUNTER — Encounter: Payer: Self-pay | Admitting: Internal Medicine

## 2012-01-25 ENCOUNTER — Ambulatory Visit (INDEPENDENT_AMBULATORY_CARE_PROVIDER_SITE_OTHER): Payer: Medicare Other | Admitting: Internal Medicine

## 2012-01-25 ENCOUNTER — Ambulatory Visit (INDEPENDENT_AMBULATORY_CARE_PROVIDER_SITE_OTHER)
Admission: RE | Admit: 2012-01-25 | Discharge: 2012-01-25 | Disposition: A | Discharge status: Home / Self Care | Payer: Medicare Other | Source: Ambulatory Visit | Attending: Internal Medicine | Admitting: Internal Medicine

## 2012-01-25 ENCOUNTER — Encounter: Payer: Self-pay | Admitting: Internal Medicine

## 2012-01-25 VITALS — BP 140/80 | HR 70 | Temp 98.7°F | Resp 14 | Wt 118.0 lb

## 2012-01-25 DIAGNOSIS — M79609 Pain in unspecified limb: Secondary | ICD-10-CM | POA: Diagnosis not present

## 2012-01-25 DIAGNOSIS — M79646 Pain in unspecified finger(s): Secondary | ICD-10-CM

## 2012-01-25 DIAGNOSIS — IMO0001 Reserved for inherently not codable concepts without codable children: Secondary | ICD-10-CM | POA: Diagnosis not present

## 2012-01-25 DIAGNOSIS — L659 Nonscarring hair loss, unspecified: Secondary | ICD-10-CM

## 2012-01-25 DIAGNOSIS — R252 Cramp and spasm: Secondary | ICD-10-CM

## 2012-01-25 DIAGNOSIS — T148XXA Other injury of unspecified body region, initial encounter: Secondary | ICD-10-CM

## 2012-01-25 DIAGNOSIS — A09 Infectious gastroenteritis and colitis, unspecified: Secondary | ICD-10-CM

## 2012-01-25 DIAGNOSIS — M791 Myalgia, unspecified site: Secondary | ICD-10-CM

## 2012-01-25 DIAGNOSIS — M19039 Primary osteoarthritis, unspecified wrist: Secondary | ICD-10-CM | POA: Diagnosis not present

## 2012-01-25 LAB — CBC WITH DIFFERENTIAL/PLATELET
Basophils Absolute: 0 10*3/uL (ref 0.0–0.1)
Basophils Relative: 0.6 % (ref 0.0–3.0)
Eosinophils Absolute: 0 10*3/uL (ref 0.0–0.7)
Eosinophils Relative: 0.5 % (ref 0.0–5.0)
HCT: 43.1 % (ref 36.0–46.0)
Hemoglobin: 14.4 g/dL (ref 12.0–15.0)
Lymphocytes Relative: 50.6 % — ABNORMAL HIGH (ref 12.0–46.0)
Lymphs Abs: 2.1 10*3/uL (ref 0.7–4.0)
MCHC: 33.5 g/dL (ref 30.0–36.0)
MCV: 89.7 fl (ref 78.0–100.0)
Monocytes Absolute: 0.4 10*3/uL (ref 0.1–1.0)
Monocytes Relative: 9.4 % (ref 3.0–12.0)
Neutro Abs: 1.6 10*3/uL (ref 1.4–7.7)
Neutrophils Relative %: 38.9 % — ABNORMAL LOW (ref 43.0–77.0)
Platelets: 210 10*3/uL (ref 150.0–400.0)
RBC: 4.8 Mil/uL (ref 3.87–5.11)
RDW: 15.6 % — ABNORMAL HIGH (ref 11.5–14.6)
WBC: 4.2 10*3/uL — ABNORMAL LOW (ref 4.5–10.5)

## 2012-01-25 LAB — COMPREHENSIVE METABOLIC PANEL
ALT: 17 U/L (ref 0–35)
AST: 27 U/L (ref 0–37)
Albumin: 4.2 g/dL (ref 3.5–5.2)
Alkaline Phosphatase: 57 U/L (ref 39–117)
BUN: 25 mg/dL — ABNORMAL HIGH (ref 6–23)
CO2: 23 mEq/L (ref 19–32)
Calcium: 9 mg/dL (ref 8.4–10.5)
Chloride: 104 mEq/L (ref 96–112)
Creatinine, Ser: 0.7 mg/dL (ref 0.4–1.2)
GFR: 92.89 mL/min (ref >60.00)
Glucose, Bld: 81 mg/dL (ref 70–99)
Potassium: 4.1 mEq/L (ref 3.5–5.1)
Sodium: 138 mEq/L (ref 135–145)
Total Bilirubin: 0.3 mg/dL (ref 0.3–1.2)
Total Protein: 7.4 g/dL (ref 6.0–8.3)

## 2012-01-25 LAB — CK: Total CK: 95 U/L (ref 7–177)

## 2012-01-25 LAB — TSH: TSH: 1.7 u[IU]/mL (ref 0.35–5.50)

## 2012-01-25 LAB — MAGNESIUM: Magnesium: 1.7 mg/dL (ref 1.5–2.5)

## 2012-01-25 MED ORDER — LOSARTAN POTASSIUM 100 MG PO TABS: 100.0000 mg | ORAL_TABLET | Freq: Every day | ORAL | Status: DC

## 2012-01-25 MED ORDER — ATORVASTATIN CALCIUM 40 MG PO TABS: 20.0000 mg | ORAL_TABLET | Freq: Every day | ORAL | Status: DC

## 2012-01-25 MED ORDER — ESTRADIOL 0.5 MG PO TABS: 0.5000 mg | ORAL_TABLET | Freq: Every day | ORAL | Status: DC

## 2012-01-25 NOTE — Patient Instructions (Addendum)
Add a prenatal vitamin to your daily Hair vitamin ( it will give you  The extra folic acid and calcium that your hair needs )  We will x ray right thumb  At [facility] and consider referral to [facility] based on the results.

## 2012-01-25 NOTE — Progress Notes (Signed)
Patient ID: Kaitlyn Roman, female   DOB: 17-Sep-1945, 66 y.o.   MRN: [id number]   Subjective:     Kaitlyn Roman is a 66 y.o. female who complains of hair loss.  The hair loss is diffuse in distribution, with onset approximately 1 month ago. Patient describes symptoms of hair shedding.  Patient does not have family history of hair loss.  Patient does not have dietary restrictions. Patient does not wear a high tension hair style. Patient had serious medical illnesses or major weight loss during time of hair loss.  She is recently recovering from a prolonged infectious colitis secondary to Clostridium difficile. She lost approximately 15 pounds during this illness. She spent approximately 2 weeks in the hospital requiring IV fluids IV antibiotics and finally stool transplant. She was discharged home on Anticort for continued inflammation and this has been tapered gradually by Dr. Mechele Collin. She recently discontinued it.   2) muscle cramps occurring early in the morning.  They're occurring in both legs. They're not occurring with exercise. She does not wear high heels during the day but does wear mostly flip-flops and sandals. She is not taking a diuretic. [name] 2)  Easy bruising. She has noticed increased bruising on her arms and legs. However she is not experiencing any bleeding with flossing and has seen no blood in her stools..    The following portions of the patient's history were reviewed and updated as appropriate: allergies, current medications, past family history, past medical history, past social history, past surgical history and problem list.   Review of Systems A comprehensive review of systems was negative.   Objective:    BP 140/80  Pulse 70  Temp 98.7 F (37.1 C) (Oral)  Resp 14  Wt 118 lb (53.524 kg)  SpO2 96% General appearance: alert, cooperative and appears stated age Neck: no adenopathy, no carotid bruit, no JVD, supple, symmetrical, trachea midline and thyroid not enlarged,  symmetric, no tenderness/mass/nodules Lungs: clear to auscultation bilaterally Heart: regular rate and rhythm, S1, S2 normal, no murmur, click, rub or gallop Abdomen: soft, non-tender; bowel sounds normal; no masses,  no organomegaly Extremities: extremities normal, atraumatic, no cyanosis or edema Physical Exam Pattern of loss:  diffuse  Hair condition:   brittle  Scalp condition:   normal  Incr. hair w/ gentle pull?   no  Excess hair:   no    Assessment:    telogen effluvium   Plan:    1. Labs: none indicated at this time, CBC with diff and TSH. 2. none. 3. Verbal patient instruction given. 4. Follow up in 8 weeks.

## 2012-01-26 ENCOUNTER — Encounter: Payer: Self-pay | Admitting: Internal Medicine

## 2012-01-26 DIAGNOSIS — R252 Cramp and spasm: Secondary | ICD-10-CM | POA: Insufficient documentation

## 2012-01-26 DIAGNOSIS — A09 Infectious gastroenteritis and colitis, unspecified: Secondary | ICD-10-CM | POA: Insufficient documentation

## 2012-01-26 NOTE — Assessment & Plan Note (Signed)
He was discharged home after a two-week stay for prolonged diarrheal symptoms secondary to C. difficile colitis. She underwent stool transplant which helped resolve her diarrhea. She was discharged home on Entocort by Dr. Mechele Collin and this has recently been tapered to off. She has a day also gained a few pounds back but all in all lost 15 pounds from the ordeal.

## 2012-01-26 NOTE — Assessment & Plan Note (Signed)
Musculoskeletal exam is normal explained that her symptoms may be due to fatigue muscles do to improve her for where versus minor electrolyte disturbances or dehydration. She admits that she does not drink adequate water and basis. We will check her electrolytes and thyroid function today and increase her water intake.

## 2012-02-01 ENCOUNTER — Other Ambulatory Visit: Payer: Self-pay | Admitting: Internal Medicine

## 2012-02-01 DIAGNOSIS — M79646 Pain in unspecified finger(s): Secondary | ICD-10-CM

## 2012-02-01 NOTE — Assessment & Plan Note (Signed)
With degenerative changes at the thumb by recent plain films.  Referral to Dr. Amanda Pea requested.

## 2012-02-05 ENCOUNTER — Telehealth: Payer: Self-pay | Admitting: Internal Medicine

## 2012-02-05 NOTE — Telephone Encounter (Signed)
Pt called checking on her referrell to [facility]. i told pt you had faxed over info to them

## 2012-02-07 NOTE — Telephone Encounter (Signed)
Received fax back from [facility] stating patient has an appointment on 02/21/12 @ 9:00. I called patient to see if she was aware of the appointment and she was.

## 2012-03-11 DIAGNOSIS — K5289 Other specified noninfective gastroenteritis and colitis: Secondary | ICD-10-CM | POA: Diagnosis not present

## 2012-03-11 DIAGNOSIS — Z86010 Personal history of colonic polyps: Secondary | ICD-10-CM | POA: Diagnosis not present

## 2012-03-24 ENCOUNTER — Ambulatory Visit (INDEPENDENT_AMBULATORY_CARE_PROVIDER_SITE_OTHER): Payer: Medicare Other | Admitting: Internal Medicine

## 2012-03-24 DIAGNOSIS — Z23 Encounter for immunization: Secondary | ICD-10-CM | POA: Diagnosis not present

## 2012-03-26 DIAGNOSIS — M79609 Pain in unspecified limb: Secondary | ICD-10-CM | POA: Diagnosis not present

## 2012-04-16 DIAGNOSIS — Z1231 Encounter for screening mammogram for malignant neoplasm of breast: Secondary | ICD-10-CM | POA: Diagnosis not present

## 2012-04-16 DIAGNOSIS — R339 Retention of urine, unspecified: Secondary | ICD-10-CM | POA: Diagnosis not present

## 2012-04-16 DIAGNOSIS — Q618 Other cystic kidney diseases: Secondary | ICD-10-CM | POA: Diagnosis not present

## 2012-04-16 DIAGNOSIS — N281 Cyst of kidney, acquired: Secondary | ICD-10-CM | POA: Diagnosis not present

## 2012-04-16 LAB — HM MAMMOGRAPHY: HM Mammogram: NORMAL

## 2012-05-06 ENCOUNTER — Ambulatory Visit: Payer: Self-pay | Admitting: Unknown Physician Specialty

## 2012-05-06 DIAGNOSIS — H9190 Unspecified hearing loss, unspecified ear: Secondary | ICD-10-CM | POA: Diagnosis not present

## 2012-05-06 DIAGNOSIS — K3189 Other diseases of stomach and duodenum: Secondary | ICD-10-CM | POA: Diagnosis not present

## 2012-05-06 DIAGNOSIS — K5289 Other specified noninfective gastroenteritis and colitis: Secondary | ICD-10-CM | POA: Diagnosis not present

## 2012-05-06 DIAGNOSIS — Z79899 Other long term (current) drug therapy: Secondary | ICD-10-CM | POA: Diagnosis not present

## 2012-05-06 DIAGNOSIS — G43909 Migraine, unspecified, not intractable, without status migrainosus: Secondary | ICD-10-CM | POA: Diagnosis not present

## 2012-05-06 DIAGNOSIS — Z860100 Personal history of colon polyps, unspecified: Secondary | ICD-10-CM | POA: Diagnosis not present

## 2012-05-06 DIAGNOSIS — K648 Other hemorrhoids: Secondary | ICD-10-CM | POA: Diagnosis not present

## 2012-05-06 DIAGNOSIS — I10 Essential (primary) hypertension: Secondary | ICD-10-CM | POA: Diagnosis not present

## 2012-05-06 DIAGNOSIS — K219 Gastro-esophageal reflux disease without esophagitis: Secondary | ICD-10-CM | POA: Diagnosis not present

## 2012-05-06 LAB — HM COLONOSCOPY: HM Colonoscopy: NORMAL

## 2012-05-07 LAB — PATHOLOGY REPORT

## 2012-05-21 ENCOUNTER — Encounter: Payer: Self-pay | Admitting: Internal Medicine

## 2012-06-04 ENCOUNTER — Other Ambulatory Visit: Payer: Self-pay | Admitting: Internal Medicine

## 2012-06-04 NOTE — Telephone Encounter (Signed)
[facility]  Fax # [phone]                          Phone # [phone]   atorvastatin (LIPITOR) 40 MG tablet  losartan (COZAAR) 100 MG tablet  estradiol (ESTRACE) 0.5 MG tablet

## 2012-06-04 NOTE — Telephone Encounter (Signed)
Med filled.  

## 2012-06-09 ENCOUNTER — Other Ambulatory Visit: Payer: Self-pay | Admitting: General Practice

## 2012-06-09 MED ORDER — ATORVASTATIN CALCIUM 40 MG PO TABS: ORAL_TABLET | ORAL | Status: DC

## 2012-06-09 MED ORDER — ESTRADIOL 0.5 MG PO TABS: 0.5000 mg | ORAL_TABLET | Freq: Every day | ORAL | Status: DC

## 2012-06-09 MED ORDER — LOSARTAN POTASSIUM 100 MG PO TABS: 100.0000 mg | ORAL_TABLET | Freq: Every day | ORAL | Status: DC

## 2012-08-19 ENCOUNTER — Ambulatory Visit (INDEPENDENT_AMBULATORY_CARE_PROVIDER_SITE_OTHER): Payer: Medicare Other | Admitting: Internal Medicine

## 2012-08-19 ENCOUNTER — Encounter: Payer: Self-pay | Admitting: Internal Medicine

## 2012-08-19 VITALS — BP 120/78 | HR 72 | Temp 97.6°F | Resp 16 | Wt 128.2 lb

## 2012-08-19 DIAGNOSIS — E785 Hyperlipidemia, unspecified: Secondary | ICD-10-CM

## 2012-08-19 DIAGNOSIS — Z79899 Other long term (current) drug therapy: Secondary | ICD-10-CM | POA: Diagnosis not present

## 2012-08-19 DIAGNOSIS — L659 Nonscarring hair loss, unspecified: Secondary | ICD-10-CM | POA: Diagnosis not present

## 2012-08-19 DIAGNOSIS — E559 Vitamin D deficiency, unspecified: Secondary | ICD-10-CM

## 2012-08-19 DIAGNOSIS — I10 Essential (primary) hypertension: Secondary | ICD-10-CM

## 2012-08-19 LAB — COMPREHENSIVE METABOLIC PANEL
ALT: 17 U/L (ref 0–35)
AST: 25 U/L (ref 0–37)
Albumin: 4.4 g/dL (ref 3.5–5.2)
Alkaline Phosphatase: 59 U/L (ref 39–117)
BUN: 29 mg/dL — ABNORMAL HIGH (ref 6–23)
CO2: 29 mEq/L (ref 19–32)
Calcium: 9.5 mg/dL (ref 8.4–10.5)
Chloride: 103 mEq/L (ref 96–112)
Creatinine, Ser: 0.7 mg/dL (ref 0.4–1.2)
GFR: 84 mL/min (ref >60.00)
Glucose, Bld: 95 mg/dL (ref 70–99)
Potassium: 4.8 mEq/L (ref 3.5–5.1)
Sodium: 140 mEq/L (ref 135–145)
Total Bilirubin: 0.7 mg/dL (ref 0.3–1.2)
Total Protein: 7.3 g/dL (ref 6.0–8.3)

## 2012-08-19 MED ORDER — MELOXICAM 15 MG PO TABS: 15.0000 mg | ORAL_TABLET | Freq: Every day | ORAL | Status: DC

## 2012-08-19 MED ORDER — TETANUS-DIPHTH-ACELL PERTUSSIS 5-2.5-18.5 LF-MCG/0.5 IM SUSP: 0.5000 mL | Freq: Once | INTRAMUSCULAR | Status: DC

## 2012-08-19 NOTE — Progress Notes (Signed)
Patient ID: Kaitlyn Roman, female   DOB: 08/04/1945, 67 y.o.   MRN: [id number]   Patient Active Problem List  Diagnosis  . Hyperlipidemia  . Hypertension  . OA (osteoarthritis)  . Polymyalgia rheumatica  . Neuropathy  . Muscle cramps at night  . Alopecia    Subjective:  CC:   Chief Complaint  Patient presents with  . Follow-up    HPI:   Kaitlyn Roman a 67 y.o. female who presents for Followup on chronic issues including hyperlipidemia alopecia and recent prolonged hospitalization for C. difficile colitis. Patient states that her hair has started to grow back and is more coarse and curly in texture. She's having a difficult time with that although she is happy that the alopecia has resolved. Her stools have remained normal. She continues to take a probiotic daily as well as vitamin D she is returning to her normal functional level. She is exercising several times a week at local gym. She has gained back the weight lost during her prolonged GI illness.  Her muscle cramps have resolved. I .  Past Medical History  Diagnosis Date  . Polymyalgia rheumatica syndrome     rheumatoloigst Dr Freida Busman at [facility]  . Hyperlipidemia   . Hypertension   . Colitis, infectious April 2013     C dificile colitis, hosp [facility] April 2013 x 2 weeks    Past Surgical History  Procedure Laterality Date  . Abdominal hysterectomy    . Craniectomy for excision of acoustic neuroma         The following portions of the patient's history were reviewed and updated as appropriate: Allergies, current medications, and problem list.    Review of Systems:   Patient denies headache, fevers, malaise, unintentional weight loss, skin rash, eye pain, sinus congestion and sinus pain, sore throat, dysphagia,  hemoptysis , cough, dyspnea, wheezing, chest pain, palpitations, orthopnea, edema, abdominal pain, nausea, melena, diarrhea, constipation, flank pain, dysuria, hematuria, urinary  Frequency,  nocturia, numbness, tingling, seizures,  Focal weakness, Loss of consciousness,  Tremor, insomnia, depression, anxiety, and suicidal ideation.     History   Social History  . Marital Status: Married    Spouse Name: N/A    Number of Children: N/A  . Years of Education: N/A   Occupational History  . Not on file.   Social History Main Topics  . Smoking status: Never Smoker   . Smokeless tobacco: Never Used  . Alcohol Use: Yes     Comment: one cocctail nightly   . Drug Use: No  . Sexually Active: Not on file   Other Topics Concern  . Not on file   Social History Narrative  . No narrative on file    Objective:  BP 120/78  Pulse 72  Temp(Src) 97.6 F (36.4 C) (Oral)  Resp 16  Wt 128 lb 4 oz (58.174 kg)  BMI 22.72 kg/m2  SpO2 98%  General appearance: alert, cooperative and appears stated age Ears: normal TM's and external ear canals both ears Throat: lips, mucosa, and tongue normal; teeth and gums normal Neck: no adenopathy, no carotid bruit, supple, symmetrical, trachea midline and thyroid not enlarged, symmetric, no tenderness/mass/nodules Back: symmetric, no curvature. ROM normal. No CVA tenderness. Lungs: clear to auscultation bilaterally Heart: regular rate and rhythm, S1, S2 normal, no murmur, click, rub or gallop Abdomen: soft, non-tender; bowel sounds normal; no masses,  no organomegaly Pulses: 2+ and symmetric Skin: Skin color, texture, turgor normal. No rashes or lesions Lymph nodes:  Cervical, supraclavicular, and axillary nodes normal.  Assessment and Plan:  Alopecia Resolving, secondary to the stress of a severe GI illness.  Hyperlipidemia Well controlled, LDL excellent on current therapy.  Liver and kidney function are normal.  No changes today.  Repeat CMET and lipids in 6 months    Hypertension Well controlled on current regimen. Renal function stable, no changes today.   Updated Medication List Outpatient Encounter Prescriptions as of  08/19/2012  Medication Sig Dispense Refill  . aspirin 81 MG tablet Take 81 mg by mouth daily.      [name] atorvastatin (LIPITOR) 40 MG tablet TAKE ONE TABLET BY MOUTH EVERY DAY  90 tablet  3  . Biotin 10 MG CAPS Take by mouth.      . estradiol (ESTRACE) 0.5 MG tablet Take 1 tablet (0.5 mg total) by mouth daily.  90 tablet  3  . losartan (COZAAR) 100 MG tablet Take 1 tablet (100 mg total) by mouth daily.  90 tablet  3  . meloxicam (MOBIC) 15 MG tablet Take 1 tablet (15 mg total) by mouth daily.  90 tablet  2  . Multiple Vitamins-Minerals (HAIR/SKIN/NAILS PO) Take by mouth.      . traMADol (ULTRAM) 50 MG tablet Take 50 mg by mouth every 6 (six) hours as needed.        . [DISCONTINUED] meloxicam (MOBIC) 15 MG tablet Take 1 tablet (15 mg total) by mouth daily.  30 tablet  4  . ondansetron (ZOFRAN) 4 MG tablet Take 4 mg by mouth every 8 (eight) hours as needed.      . TDaP (BOOSTRIX) 5-2.5-18.5 LF-MCG/0.5 injection Inject 0.5 mLs into the muscle once.  0.5 mL  0   No facility-administered encounter medications on file as of 08/19/2012.     Orders Placed This Encounter  Procedures  . HM DEXA SCAN  . Comprehensive metabolic panel  . Vitamin D 25 hydroxy    No Follow-up on file.

## 2012-08-19 NOTE — Patient Instructions (Addendum)
We may  Repeat your bone density test this year  Let me know how much vitamin d you are taking daily

## 2012-08-20 ENCOUNTER — Encounter: Payer: Self-pay | Admitting: Internal Medicine

## 2012-08-20 DIAGNOSIS — L659 Nonscarring hair loss, unspecified: Secondary | ICD-10-CM | POA: Insufficient documentation

## 2012-08-20 LAB — VITAMIN D 25 HYDROXY (VIT D DEFICIENCY, FRACTURES): Vit D, 25-Hydroxy: 57 ng/mL (ref 30–89)

## 2012-08-20 NOTE — Assessment & Plan Note (Signed)
Well controlled on current regimen. Renal function stable, no changes today. 

## 2012-08-20 NOTE — Assessment & Plan Note (Signed)
Resolving, secondary to the stress of a severe GI illness.

## 2012-08-20 NOTE — Assessment & Plan Note (Signed)
Well controlled, LDL excellent on current therapy.  Liver and kidney function are normal.  No changes today.  Repeat CMET and lipids in 6 months

## 2012-12-24 DIAGNOSIS — M19049 Primary osteoarthritis, unspecified hand: Secondary | ICD-10-CM | POA: Diagnosis not present

## 2013-02-24 ENCOUNTER — Ambulatory Visit (INDEPENDENT_AMBULATORY_CARE_PROVIDER_SITE_OTHER): Payer: Medicare Other | Admitting: Internal Medicine

## 2013-02-24 ENCOUNTER — Encounter: Payer: Self-pay | Admitting: Internal Medicine

## 2013-02-24 VITALS — BP 152/84 | HR 71 | Temp 98.1°F | Resp 14 | Ht 63.0 in | Wt 129.2 lb

## 2013-02-24 DIAGNOSIS — E785 Hyperlipidemia, unspecified: Secondary | ICD-10-CM | POA: Diagnosis not present

## 2013-02-24 DIAGNOSIS — R252 Cramp and spasm: Secondary | ICD-10-CM

## 2013-02-24 DIAGNOSIS — Z23 Encounter for immunization: Secondary | ICD-10-CM | POA: Diagnosis not present

## 2013-02-24 DIAGNOSIS — Z79899 Other long term (current) drug therapy: Secondary | ICD-10-CM

## 2013-02-24 DIAGNOSIS — I10 Essential (primary) hypertension: Secondary | ICD-10-CM

## 2013-02-24 DIAGNOSIS — M353 Polymyalgia rheumatica: Secondary | ICD-10-CM | POA: Diagnosis not present

## 2013-02-24 LAB — CBC WITH DIFFERENTIAL/PLATELET
Basophils Absolute: 0 10*3/uL (ref 0.0–0.1)
Basophils Relative: 0.6 % (ref 0.0–3.0)
Eosinophils Absolute: 0 10*3/uL (ref 0.0–0.7)
Eosinophils Relative: 0.5 % (ref 0.0–5.0)
HCT: 42.5 % (ref 36.0–46.0)
Hemoglobin: 14.5 g/dL (ref 12.0–15.0)
Lymphocytes Relative: 47.6 % — ABNORMAL HIGH (ref 12.0–46.0)
Lymphs Abs: 2 10*3/uL (ref 0.7–4.0)
MCHC: 34.1 g/dL (ref 30.0–36.0)
MCV: 94.5 fl (ref 78.0–100.0)
Monocytes Absolute: 0.4 10*3/uL (ref 0.1–1.0)
Monocytes Relative: 8.8 % (ref 3.0–12.0)
Neutro Abs: 1.8 10*3/uL (ref 1.4–7.7)
Neutrophils Relative %: 42.5 % — ABNORMAL LOW (ref 43.0–77.0)
Platelets: 260 10*3/uL (ref 150.0–400.0)
RBC: 4.5 Mil/uL (ref 3.87–5.11)
RDW: 13.6 % (ref 11.5–14.6)
WBC: 4.1 10*3/uL — ABNORMAL LOW (ref 4.5–10.5)

## 2013-02-24 LAB — COMPREHENSIVE METABOLIC PANEL
ALT: 16 U/L (ref 0–35)
AST: 24 U/L (ref 0–37)
Albumin: 4.3 g/dL (ref 3.5–5.2)
Alkaline Phosphatase: 54 U/L (ref 39–117)
BUN: 25 mg/dL — ABNORMAL HIGH (ref 6–23)
CO2: 25 mEq/L (ref 19–32)
Calcium: 9.3 mg/dL (ref 8.4–10.5)
Chloride: 105 mEq/L (ref 96–112)
Creatinine, Ser: 0.7 mg/dL (ref 0.4–1.2)
GFR: 89.51 mL/min (ref >60.00)
Glucose, Bld: 111 mg/dL — ABNORMAL HIGH (ref 70–99)
Potassium: 4.1 mEq/L (ref 3.5–5.1)
Sodium: 137 mEq/L (ref 135–145)
Total Bilirubin: 0.8 mg/dL (ref 0.3–1.2)
Total Protein: 7.4 g/dL (ref 6.0–8.3)

## 2013-02-24 LAB — LDL CHOLESTEROL, DIRECT: Direct LDL: 123.1 mg/dL

## 2013-02-24 LAB — C-REACTIVE PROTEIN: CRP: <0.5 mg/dL (ref 0.5–20.0)

## 2013-02-24 LAB — SEDIMENTATION RATE: Sed Rate: 15 mm/hr (ref 0–22)

## 2013-02-24 NOTE — Addendum Note (Signed)
Addended by: Dennie Bible on: 02/24/2013 02:21 PM   Modules accepted: Orders

## 2013-02-24 NOTE — Assessment & Plan Note (Signed)
Recurrent, may be due to use of statin medication. Patient biased to increase water intake to 316 ounce bottles daily and to try one or 2 ounces of tonic water in the evening.

## 2013-02-24 NOTE — Progress Notes (Signed)
Patient ID: Kaitlyn Roman, female   DOB: Jan 08, 1946, 67 y.o.   MRN: [id number]  Patient Active Problem List   Diagnosis Date Noted  . Alopecia 08/20/2012  . Muscle cramps at night 01/26/2012  . OA (osteoarthritis) 08/28/2011  . Polymyalgia rheumatica 08/28/2011  . Neuropathy 08/28/2011  . Hyperlipidemia   . Hypertension     Subjective:  CC:   Chief Complaint  Patient presents with  . Follow-up    6 month    HPI:   Kaitlyn O'Ferrellis a 67 y.o. female who presents for 6 month follow up on hypertension and hyperlipidemia.  In the interim she reports that her polyarthritis has been worse lately.  ? PMR recurring .  Right shoulder,  hands and  right thumb.  The thumb joint is degenerating and now has a cyst,  The problem is managed by Alric Quan is anticpating need for joint replacement .  The joints atre worst in the morning and improve with exercise and motion.,  She is alternating between meloxicam and naproxen.   Muscle cramps, calf and foot,  Usually at night or while at the gym   Has follow up with nephrology t [facility] in Dec for renal cysts,  NOT PCKD Uses naproxen and meloxicam intermittently  Never both  .  Last dose yesterday naproxen AM 220 mg .  No fluid retention   bp elevated today,  Last 3 days 129/81,  134/89 and 120/91   Pulse always 69 did not take medications today    Past Medical History  Diagnosis Date  . Polymyalgia rheumatica syndrome     rheumatoloigst Dr Freida Busman at [facility]  . Hyperlipidemia   . Hypertension   . Colitis, infectious April 2013     C dificile colitis, hosp [facility] April 2013 x 2 weeks    Past Surgical History  Procedure Laterality Date  . Abdominal hysterectomy    . Craniectomy for excision of acoustic neuroma         The following portions of the patient's history were reviewed and updated as appropriate: Allergies, current medications, and problem list.    Review of Systems:   12 Pt  review of systems was negative except  those addressed in the HPI,     History   Social History  . Marital Status: Married    Spouse Name: N/A    Number of Children: N/A  . Years of Education: N/A   Occupational History  . Not on file.   Social History Main Topics  . Smoking status: Never Smoker   . Smokeless tobacco: Never Used  . Alcohol Use: Yes     Comment: one cocctail nightly   . Drug Use: No  . Sexual Activity: Not on file   Other Topics Concern  . Not on file   Social History Narrative  . No narrative on file    Objective:  Filed Vitals:   02/24/13 1011  BP: 152/84  Pulse: 71  Temp: 98.1 F (36.7 C)  Resp: 14     General appearance: alert, cooperative and appears stated age Ears: normal TM's and external ear canals both ears Throat: lips, mucosa, and tongue normal; teeth and gums normal Neck: no adenopathy, no carotid bruit, supple, symmetrical, trachea midline and thyroid not enlarged, symmetric, no tenderness/mass/nodules Back: symmetric, no curvature. ROM normal. No CVA tenderness. Lungs: clear to auscultation bilaterally Heart: regular rate and rhythm, S1, S2 normal, no murmur, click, rub or gallop Abdomen: soft, non-tender; bowel sounds  normal; no masses,  no organomegaly Pulses: 2+ and symmetric Skin: Skin color, texture, turgor normal. No rashes or lesions Lymph nodes: Cervical, supraclavicular, and axillary nodes normal.  Assessment and Plan:  Hypertension Elevated today.  Did not take meds this morning.  Last nsaid dose was yeasterday mornign.,  Home readings shoed diastolic elevation the last 2 times. Ive asked patient to bring meter to office to compare readings and to recheck bp at home a minimum of 5 times over the next 4 weeks and call readings to office for adjustment of medications.    Hyperlipidemia She is taking 1/2 tablet daily of her statin. Fasting lipids will be checked.  Muscle cramps at night Recurrent, may be due to use of statin medication. Patient biased  to increase water intake to 316 ounce bottles daily and to try one or 2 ounces of tonic water in the evening.  Polymyalgia rheumatica In remission for years. However recent escalation of joint pain involving multiple areas of bodies suggest need for repeat evaluation. Sed rate and CRP have been ordered.   Updated Medication List Outpatient Encounter Prescriptions as of 02/24/2013  Medication Sig Dispense Refill  . aspirin 81 MG tablet Take 81 mg by mouth daily.      [name] atorvastatin (LIPITOR) 40 MG tablet TAKE ONE TABLET BY MOUTH EVERY DAY  90 tablet  3  . doxylamine, Sleep, (UNISOM) 25 MG tablet Take 25 mg by mouth at bedtime as needed for sleep.      [name] estradiol (ESTRACE) 0.5 MG tablet Take 0.25 mg by mouth daily.      [name] losartan (COZAAR) 100 MG tablet Take 1 tablet (100 mg total) by mouth daily.  90 tablet  3  . meloxicam (MOBIC) 15 MG tablet Take 1 tablet (15 mg total) by mouth daily.  90 tablet  2  . TDaP (BOOSTRIX) 5-2.5-18.5 LF-MCG/0.5 injection Inject 0.5 mLs into the muscle once.  0.5 mL  0  . traMADol (ULTRAM) 50 MG tablet Take 50 mg by mouth every 6 (six) hours as needed.        . [DISCONTINUED] estradiol (ESTRACE) 0.5 MG tablet Take 1 tablet (0.5 mg total) by mouth daily.  90 tablet  3  . Biotin 10 MG CAPS Take by mouth.      . Multiple Vitamins-Minerals (HAIR/SKIN/NAILS PO) Take by mouth.      . ondansetron (ZOFRAN) 4 MG tablet Take 4 mg by mouth every 8 (eight) hours as needed.       No facility-administered encounter medications on file as of 02/24/2013.

## 2013-02-24 NOTE — Patient Instructions (Addendum)
Please bring your home blood pressure kit back on Thursday afternoon  so Kaitlyn Roman can compare your machine with ours for accuracy  We are checking your cholesterol (just LDL) and kidney function today  If your PMR is flaring we will discuss starting a low dose of prednisone instead of the meloxicam/naproxen

## 2013-02-24 NOTE — Assessment & Plan Note (Addendum)
She is taking 1/2 tablet daily of her statin. Fasting lipids will be checked.

## 2013-02-24 NOTE — Assessment & Plan Note (Signed)
In remission for years. However recent escalation of joint pain involving multiple areas of bodies suggest need for repeat evaluation. Sed rate and CRP have been ordered.

## 2013-02-24 NOTE — Assessment & Plan Note (Addendum)
Elevated today.  Did not take meds this morning.  Last nsaid dose was yeasterday mornign.,  Home readings shoed diastolic elevation the last 2 times. Ive asked patient to bring meter to office to compare readings and to recheck bp at home a minimum of 5 times over the next 4 weeks and call readings to office for adjustment of medications.

## 2013-02-26 ENCOUNTER — Ambulatory Visit (INDEPENDENT_AMBULATORY_CARE_PROVIDER_SITE_OTHER): Payer: Medicare Other | Admitting: *Deleted

## 2013-02-26 VITALS — BP 112/68

## 2013-02-26 DIAGNOSIS — Z0130 Encounter for examination of blood pressure without abnormal findings: Secondary | ICD-10-CM

## 2013-02-26 DIAGNOSIS — I10 Essential (primary) hypertension: Secondary | ICD-10-CM

## 2013-02-27 ENCOUNTER — Encounter: Payer: Self-pay | Admitting: *Deleted

## 2013-03-13 ENCOUNTER — Other Ambulatory Visit: Payer: Self-pay | Admitting: Internal Medicine

## 2013-03-13 MED ORDER — ESTRADIOL 0.5 MG PO TABS: 0.2500 mg | ORAL_TABLET | Freq: Every day | ORAL | Status: DC

## 2013-03-13 MED ORDER — LOSARTAN POTASSIUM 100 MG PO TABS: 100.0000 mg | ORAL_TABLET | Freq: Every day | ORAL | Status: DC

## 2013-03-13 MED ORDER — ATORVASTATIN CALCIUM 40 MG PO TABS: ORAL_TABLET | ORAL | Status: DC

## 2013-03-13 MED ORDER — MELOXICAM 15 MG PO TABS: 15.0000 mg | ORAL_TABLET | Freq: Every day | ORAL | Status: DC

## 2013-03-13 NOTE — Telephone Encounter (Signed)
Yes, I   E refilled to [facility] but [facility] the [facility] one bc who the heck can tell which is the right one? Thank you for doing the others

## 2013-03-13 NOTE — Telephone Encounter (Signed)
Pt called inquiring that Kaitlyn Roman was supposed to send refills to [facility]  (on the day she was last in here for her apt) for the following drugs: Lipitor, Cozar, Estrace and Mobic. I do not see where these were sent in. Please advise

## 2013-03-13 NOTE — Telephone Encounter (Signed)
Ok to refill Meloxicam

## 2013-03-16 DIAGNOSIS — H524 Presbyopia: Secondary | ICD-10-CM | POA: Diagnosis not present

## 2013-03-16 DIAGNOSIS — H11009 Unspecified pterygium of unspecified eye: Secondary | ICD-10-CM | POA: Diagnosis not present

## 2013-03-16 DIAGNOSIS — Z961 Presence of intraocular lens: Secondary | ICD-10-CM | POA: Diagnosis not present

## 2013-05-06 DIAGNOSIS — Z1231 Encounter for screening mammogram for malignant neoplasm of breast: Secondary | ICD-10-CM | POA: Diagnosis not present

## 2013-05-06 DIAGNOSIS — N281 Cyst of kidney, acquired: Secondary | ICD-10-CM | POA: Diagnosis not present

## 2013-07-17 IMAGING — CR DG ABDOMEN 3V
1 series · 4 of 4 positions shown · non-contrast
Comparison: none

REASON FOR EXAM: distention
COMMENTS:

[Series 5: x abdomen erect · 0.14mm/px · 4 of 4 slices shown]
[im 1/4]
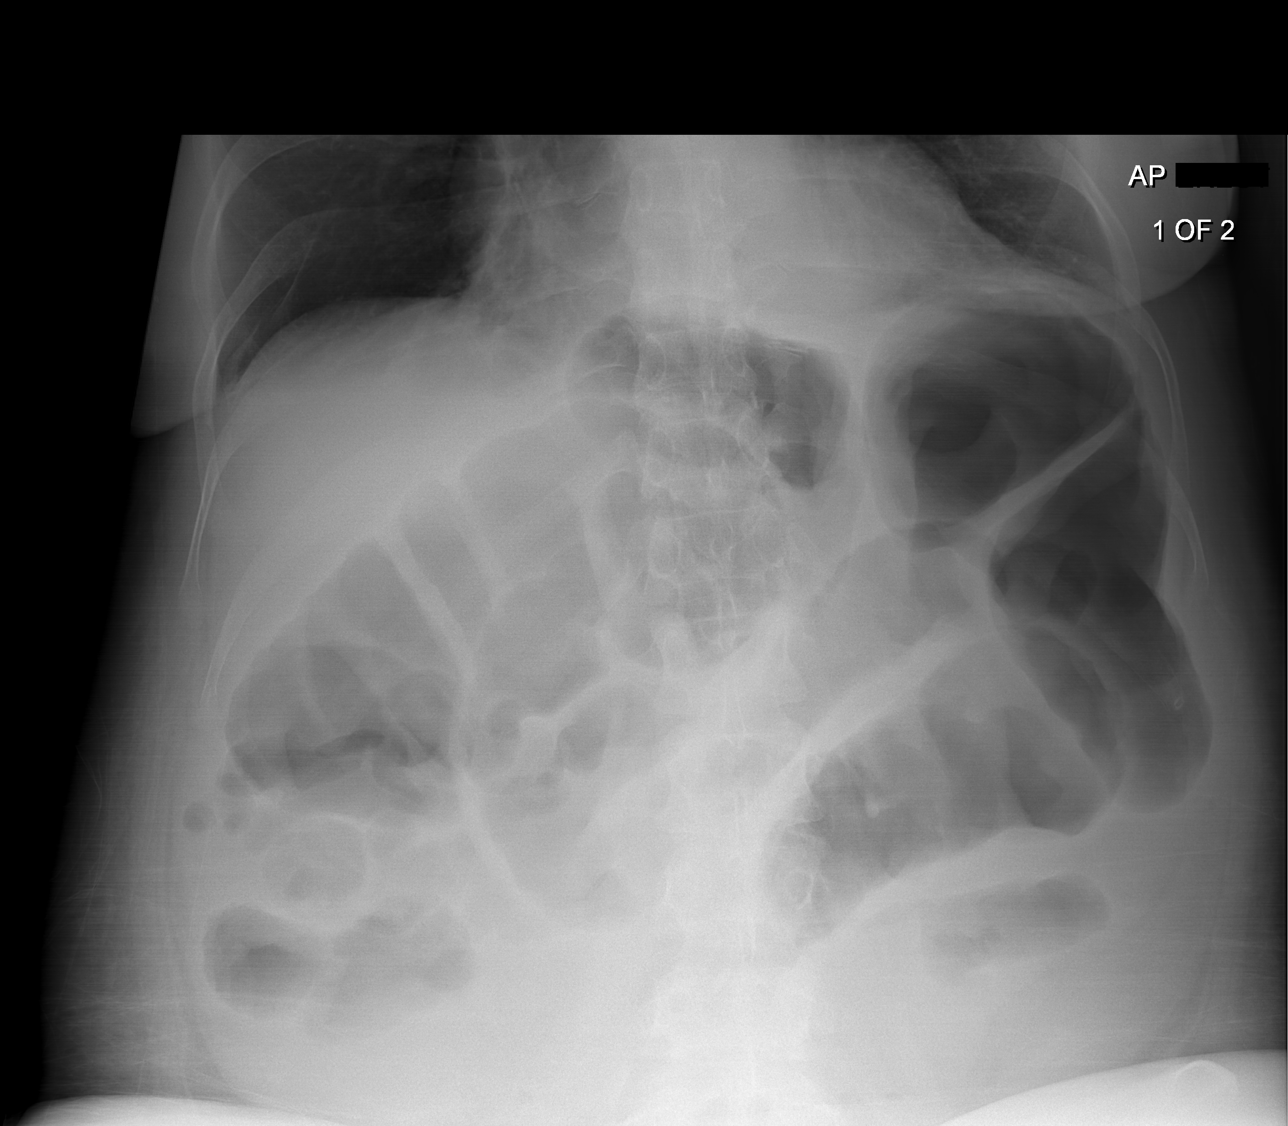
[im 2/4]
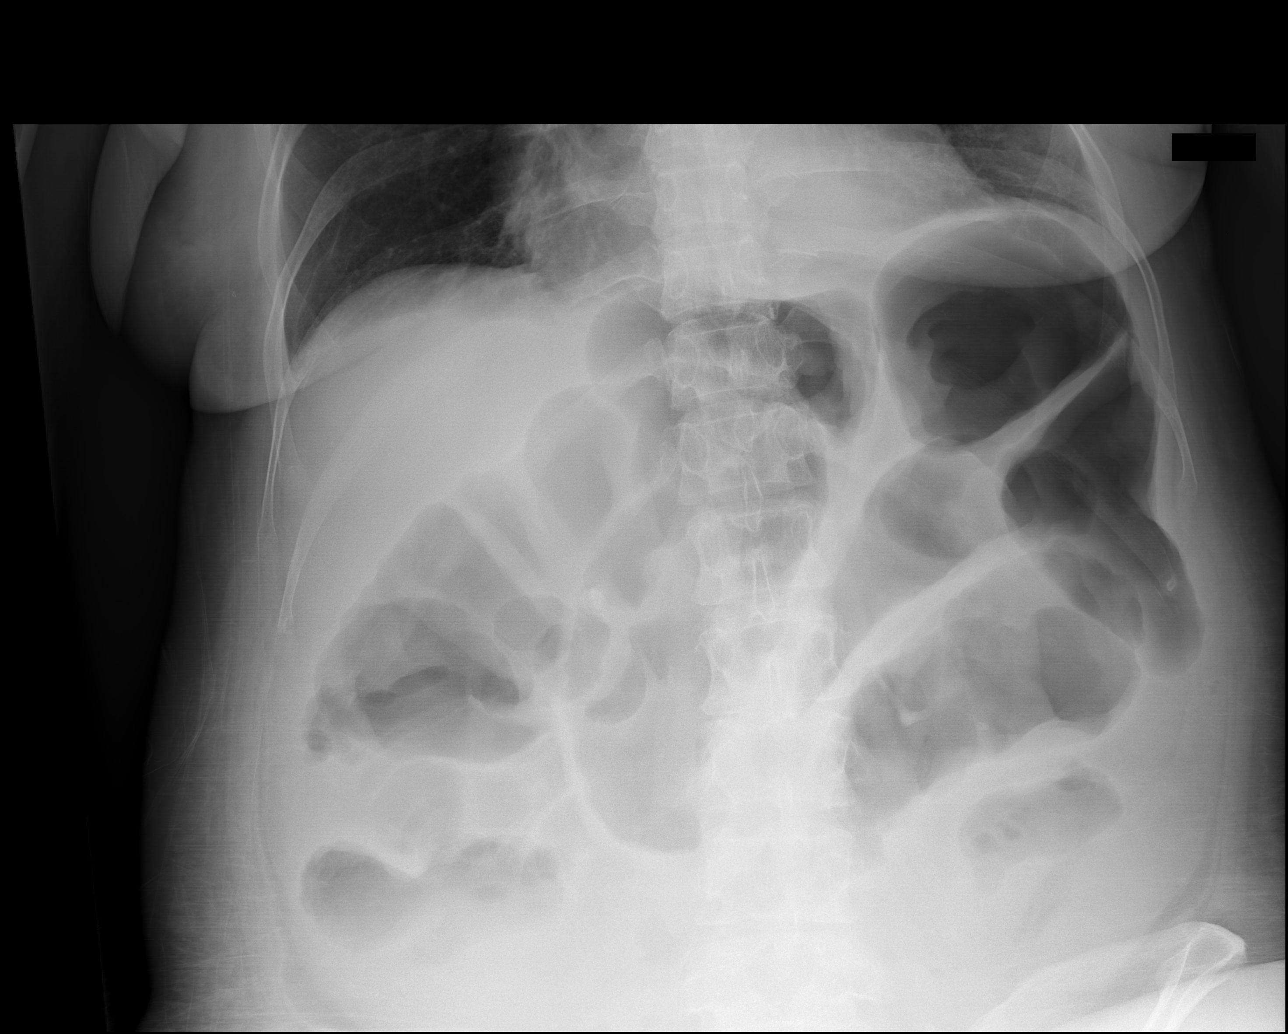
[im 3/4]
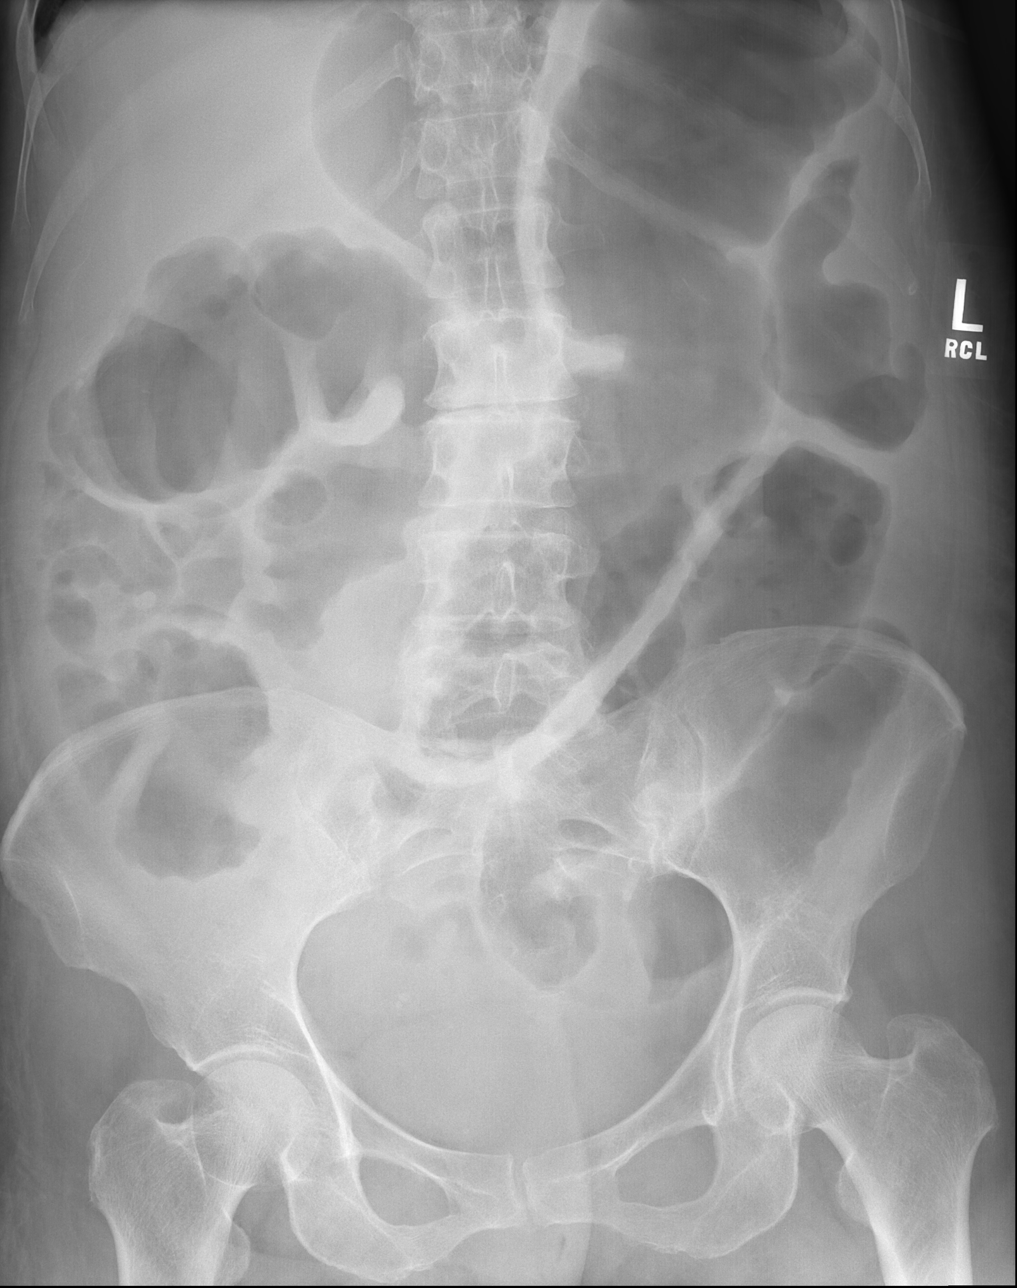
[im 4/4]
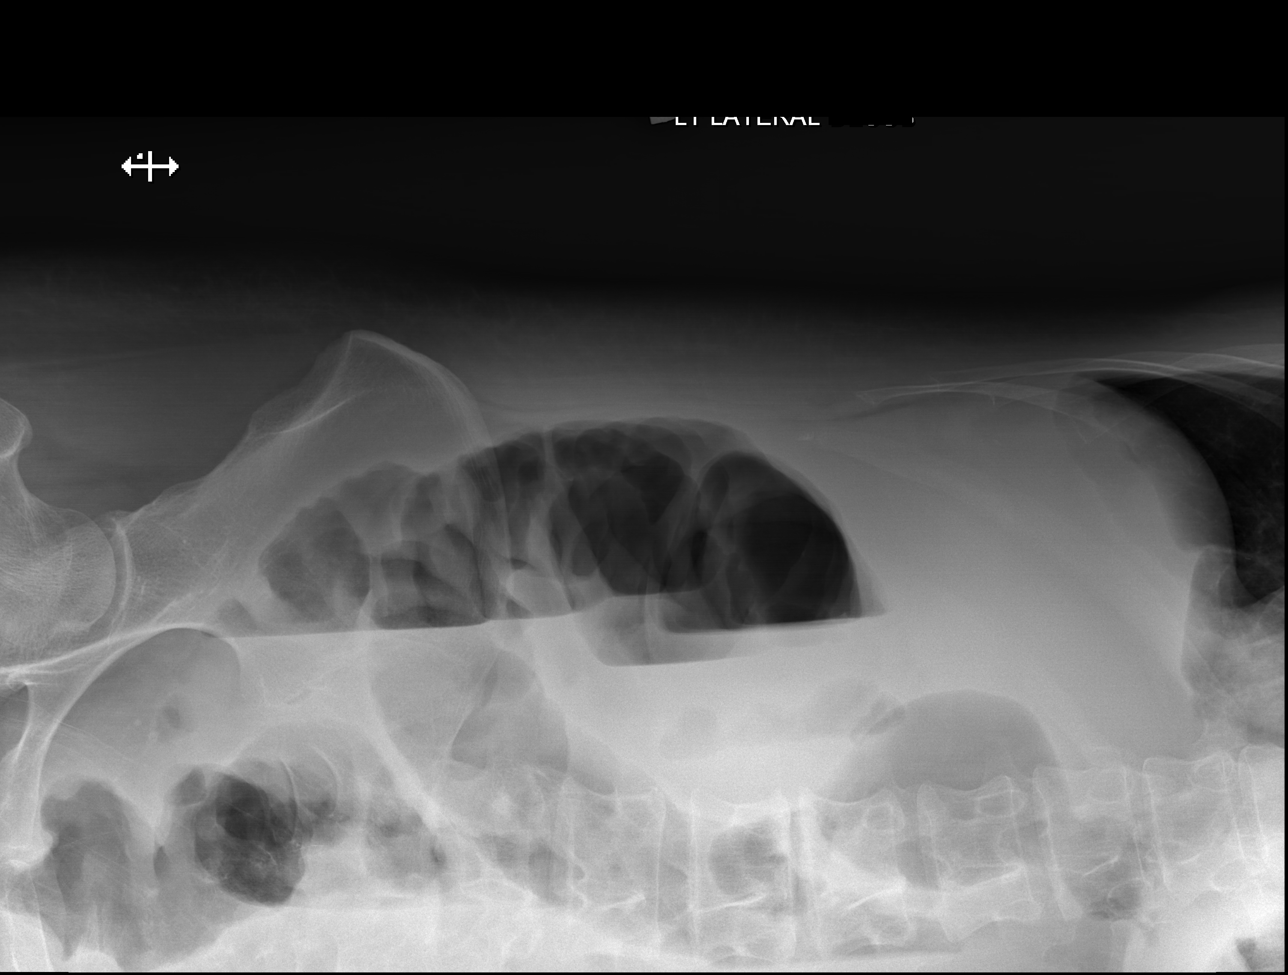

[4 of 4 positions shown; findings below may reference images not displayed]

PROCEDURE:     DXR - DXR ABDOMEN COMPLETE  - September 24, 2011  [DATE]

RESULT:     Comparison is made to the prior exam of 09/22/2011. There are
again noted multiple mild dilated loops of colon containing air-fluid
levels. No bowel obstruction is identified. No abnormal intraabdominal
calcifications are seen. There are a few phleboliths in the right pelvis.
There are degenerative changes of the mid lumbar spine.
IMPRESSION: 1. No subdiaphragmatic free air is seen.
2. There are again noted multiple dilated loops of colon containing
nonspecific air-fluid levels. The degree of bowel dilatation shows little
interval change as compared to the prior exam.
3. No findings considered suspicious for bowel obstruction are seen.

## 2013-07-18 IMAGING — CR DG ABDOMEN 2V
1 series · 3 of 3 positions shown · non-contrast
Comparison: none

REASON FOR EXAM: Continued monitoring of ileus
COMMENTS:

[Series 1: erect ap · 0.17mm/px · 3 of 3 slices shown]
[im 1/3]
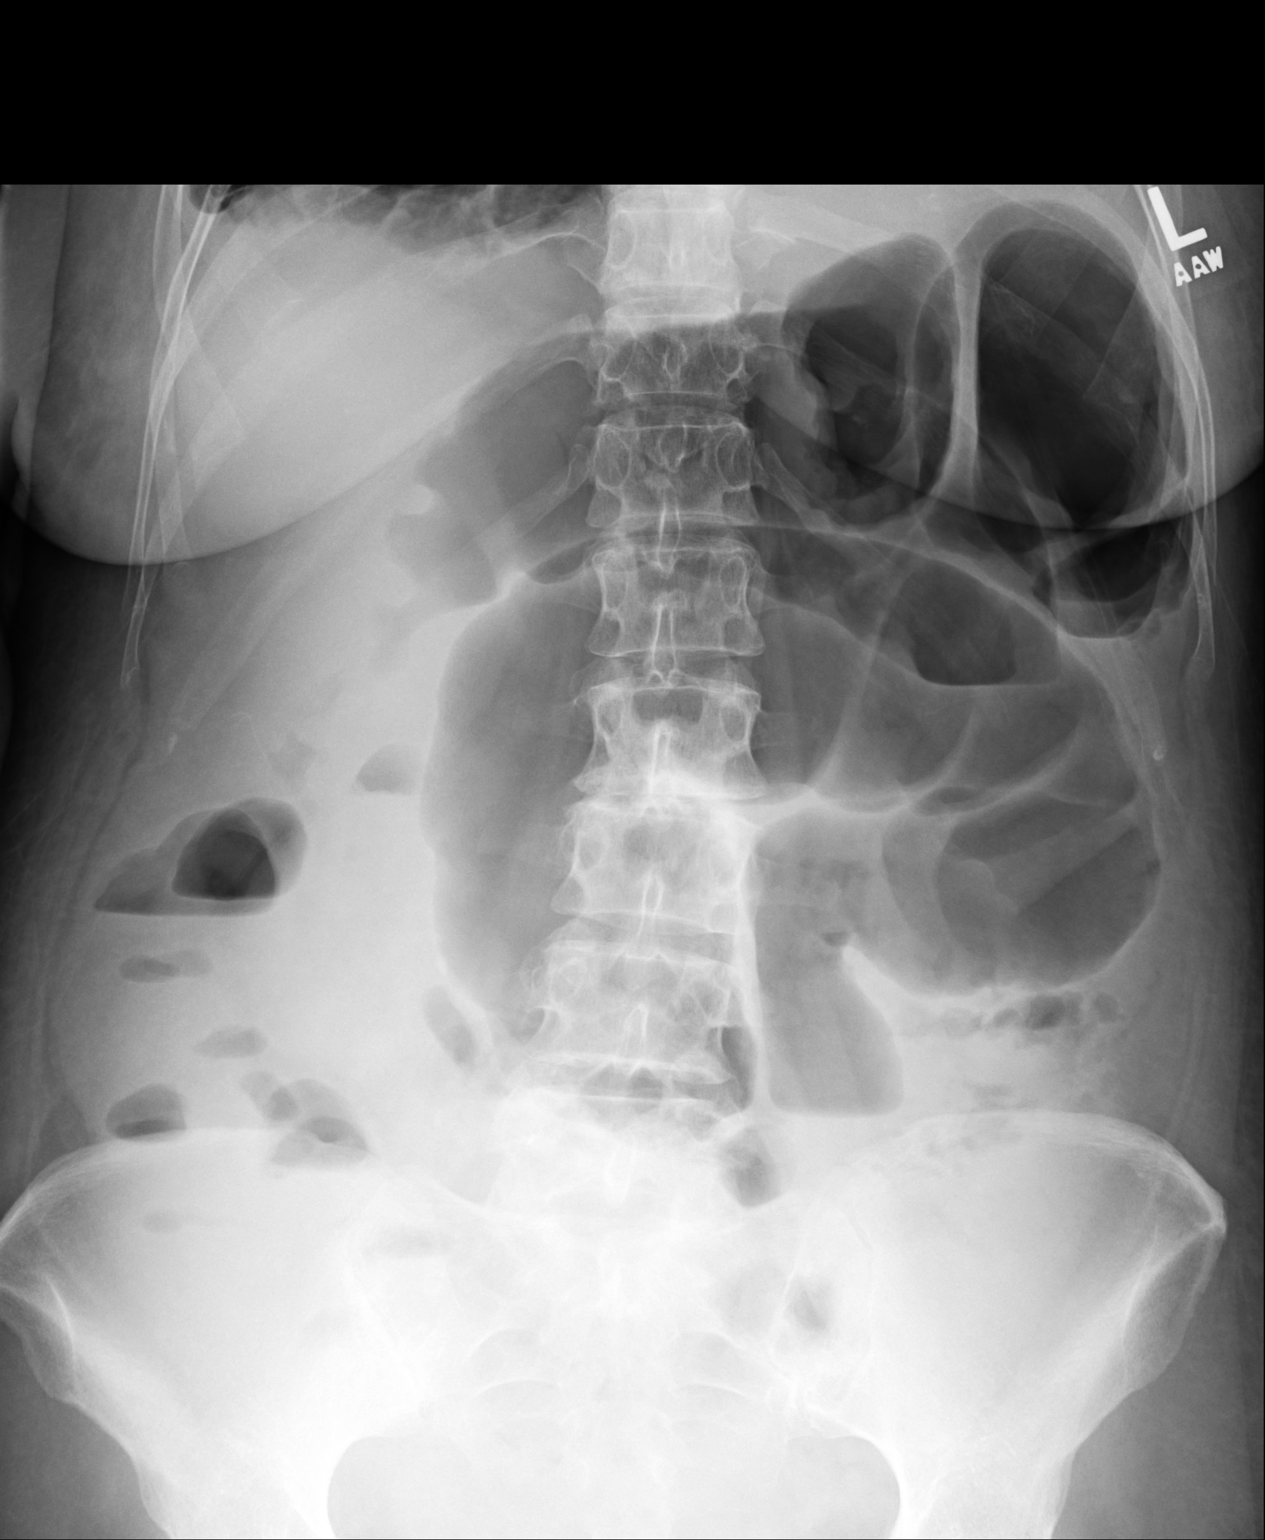
[im 2/3]
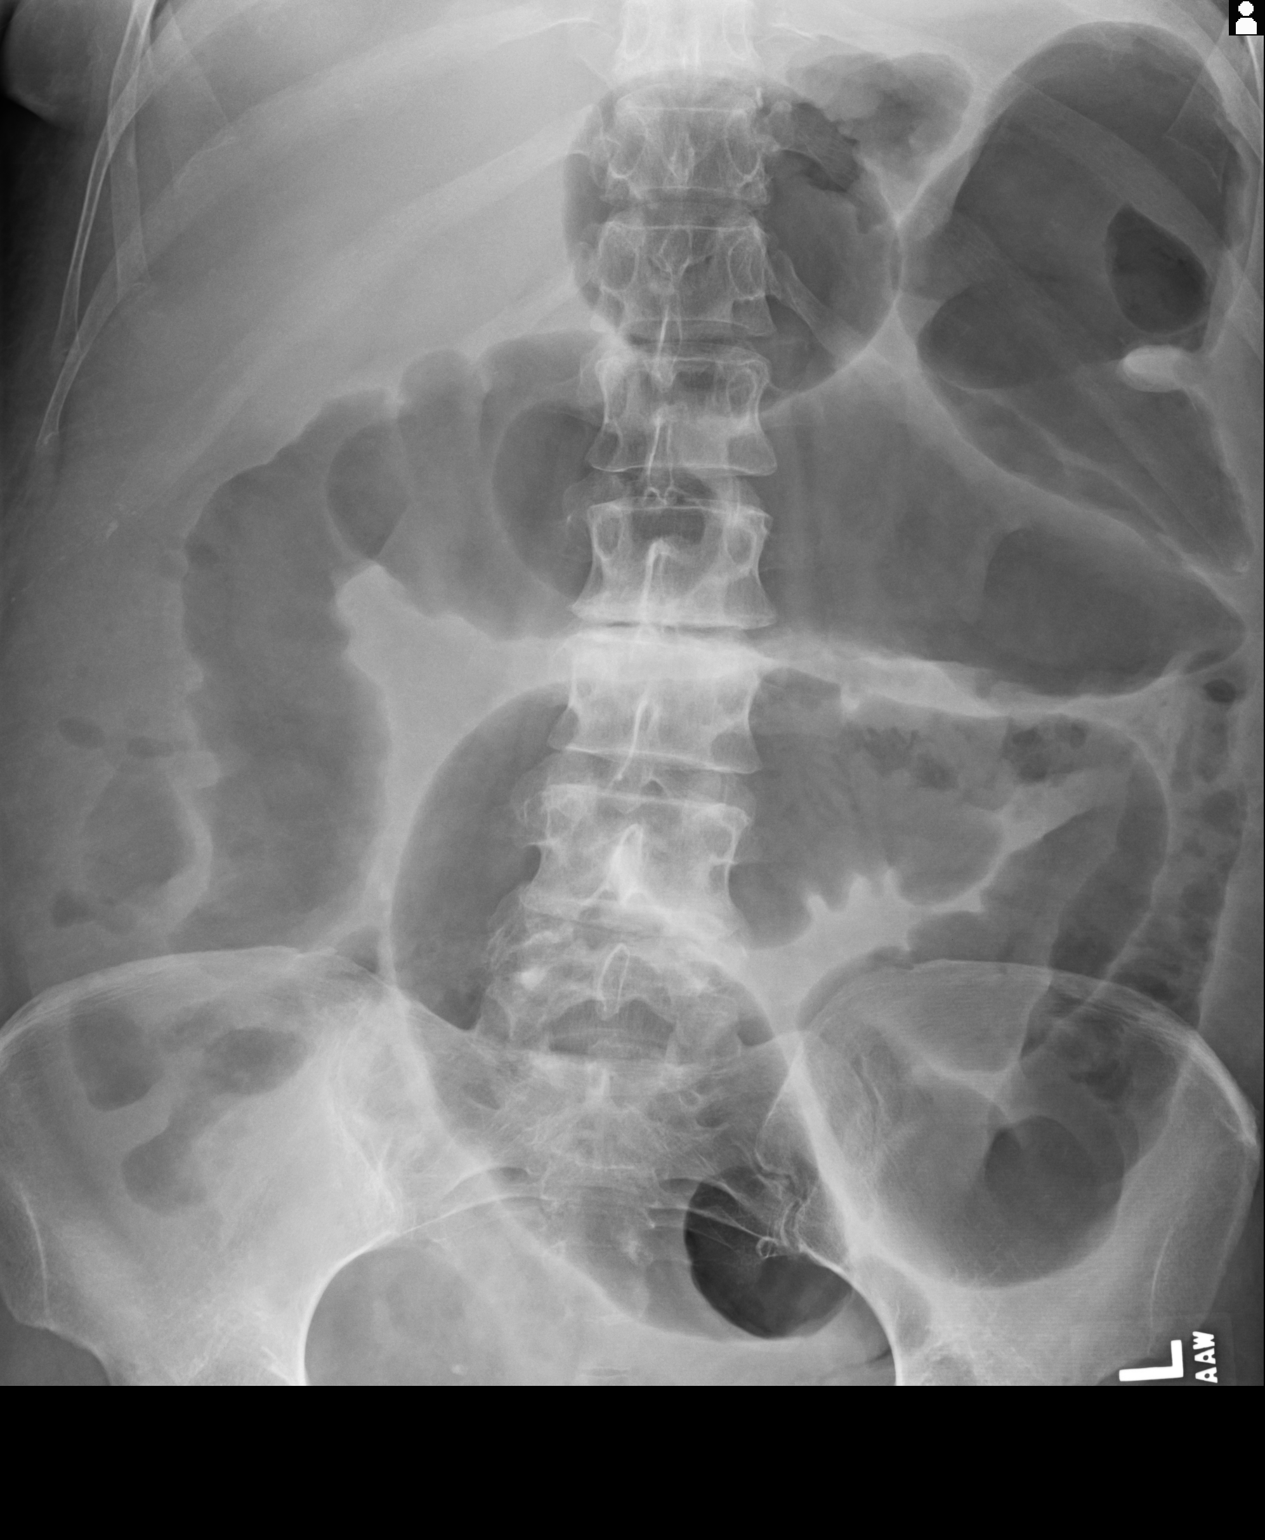
[im 3/3]
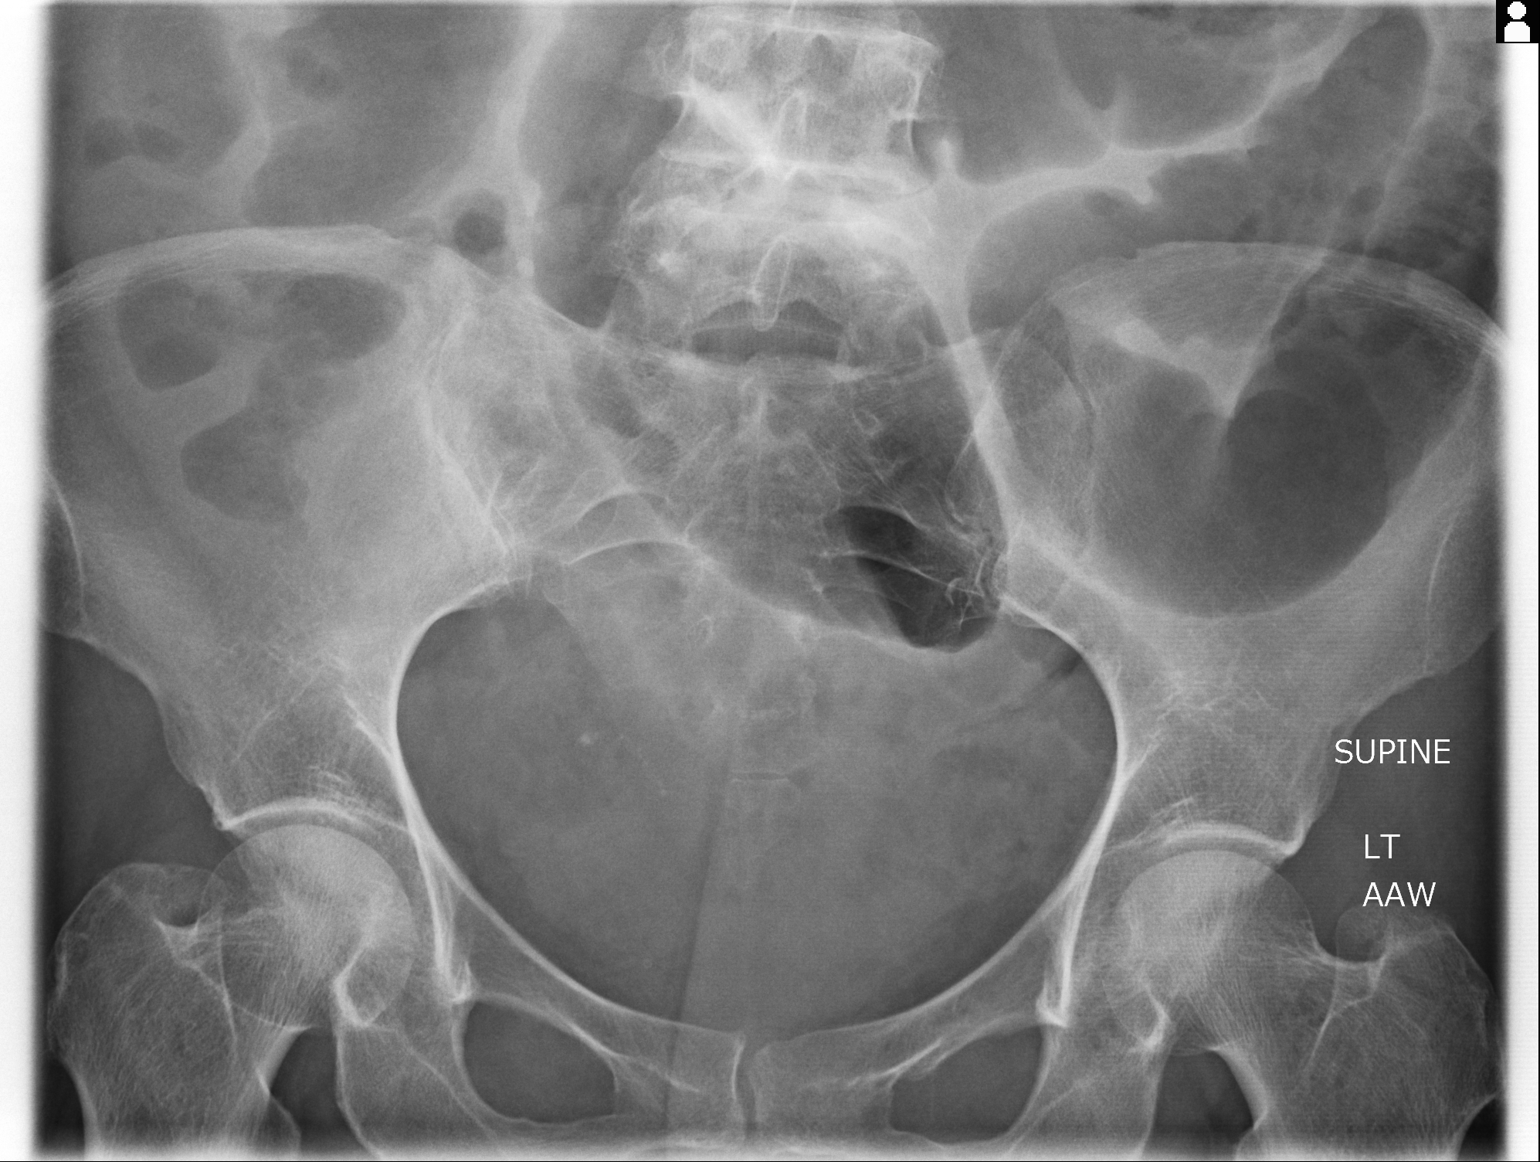

[3 of 3 positions shown; findings below may reference images not displayed]

PROCEDURE:     DXR - DXR ABDOMEN 2 V FLAT AND ERECT  - September 25, 2011 [DATE]

RESULT:     Comparison is made to the prior exam of 09/24/2011. Colonic
dilatation persists. No progressive dilatation is seen as compared to the
prior exam. There are scattered air-fluid levels in the right abdomen
compatible with fluid levels in the ileum and ascending colon. The findings
are consistent with the clinical history of enteritis.
IMPRESSION: 1. There persists colonic dilatation, but without progressive dilatation as
compared to the prior exam.
2. There are a few fluid levels noted primarily in the right abdomen and
which appear to be in small bowel and colon.

## 2013-07-20 IMAGING — CR DG ABDOMEN 3V
1 series · 4 of 4 positions shown · non-contrast
Comparison: none

REASON FOR EXAM: abd distention pain
COMMENTS:

[Series 1: erect ap · 0.17mm/px · 4 of 4 slices shown]
[im 1/4]
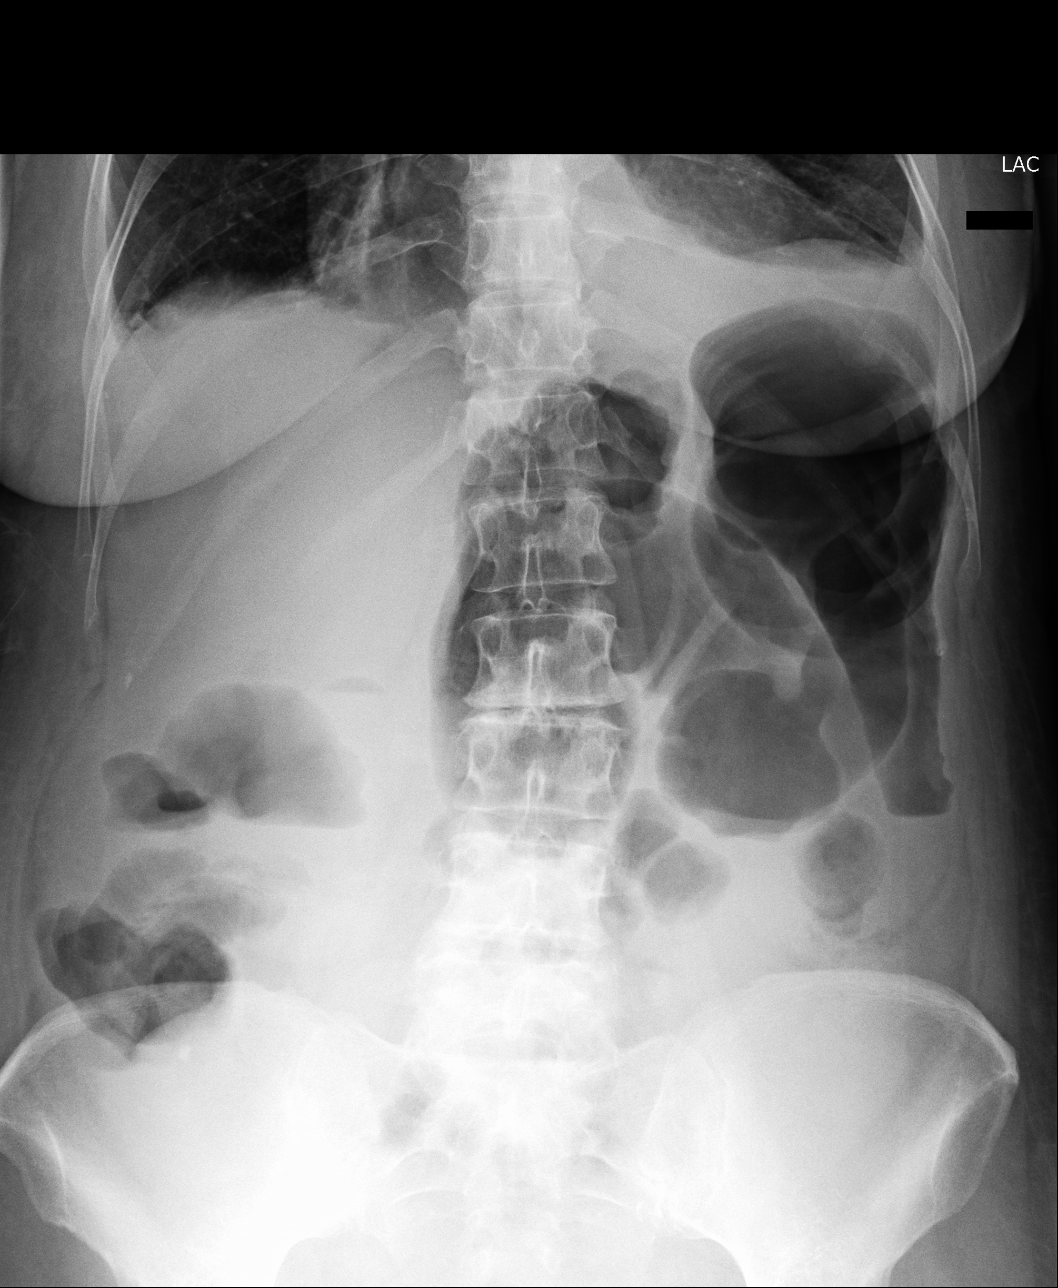
[im 2/4]
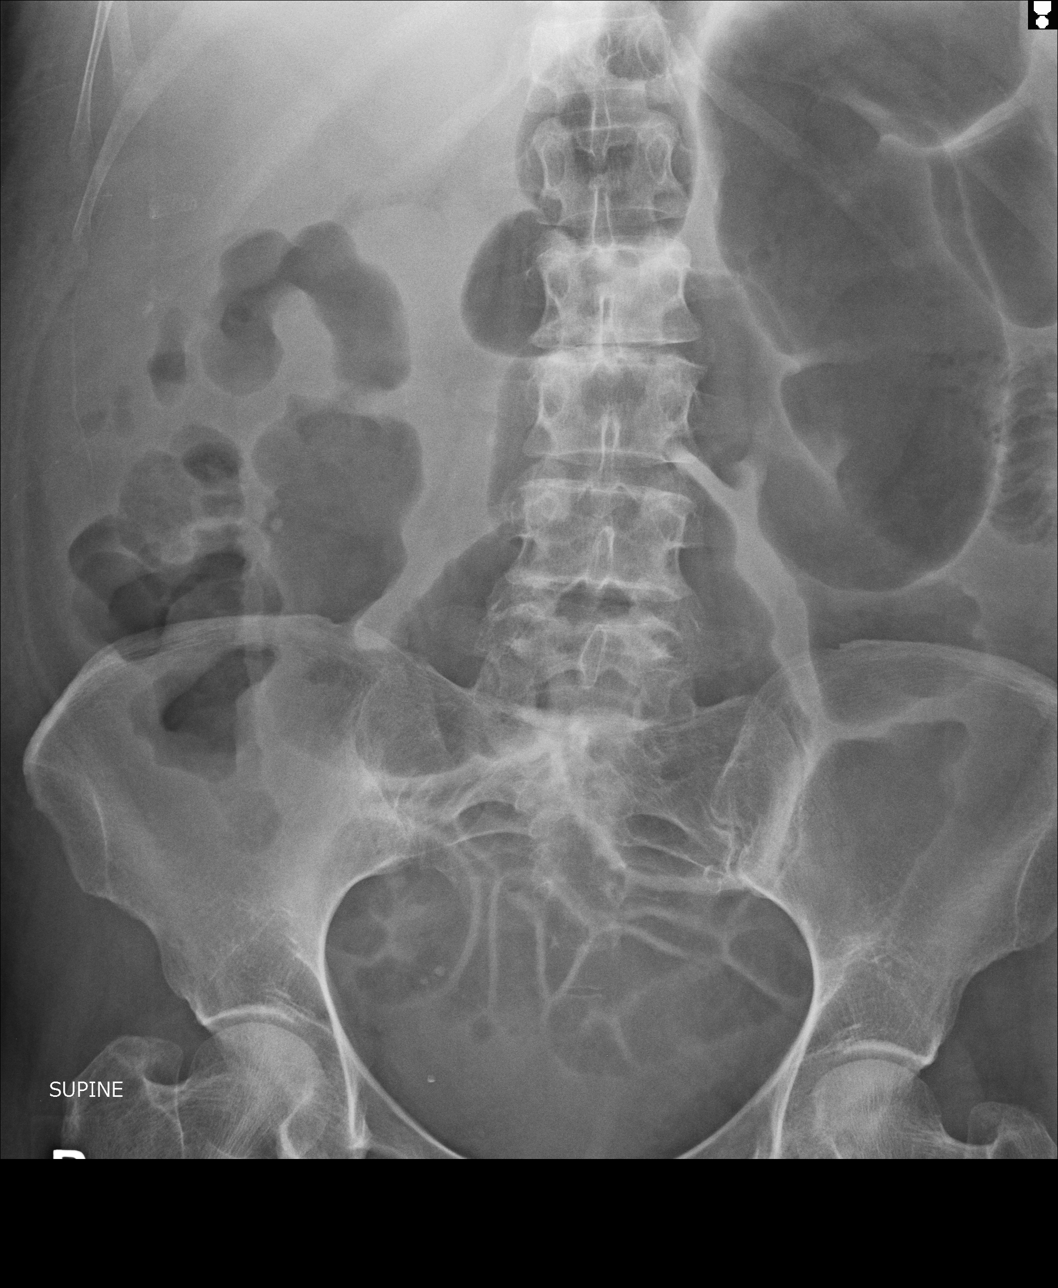
[im 3/4]
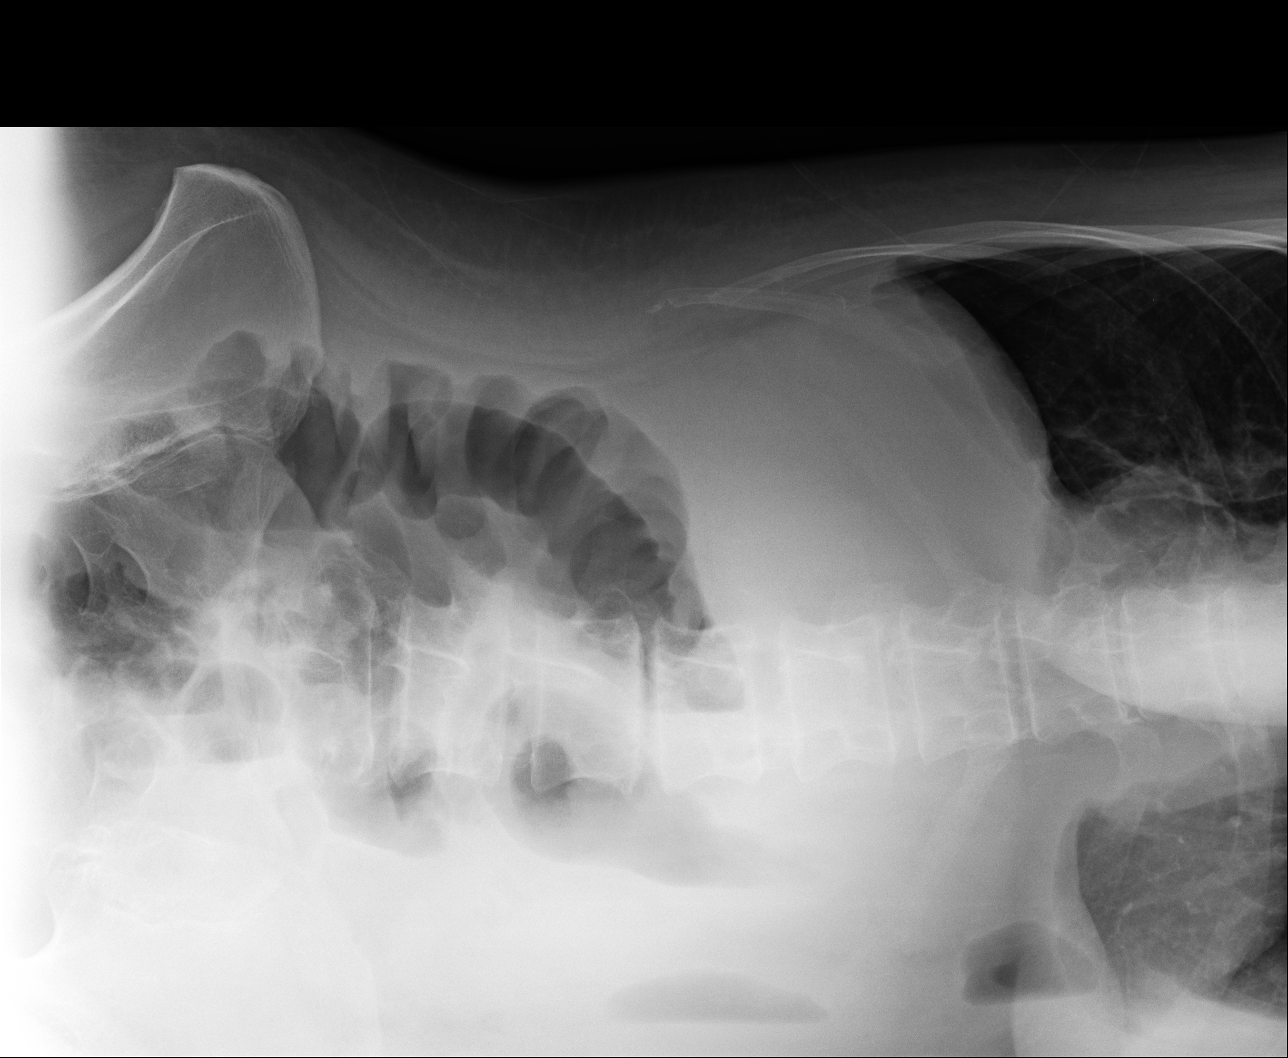
[im 4/4]
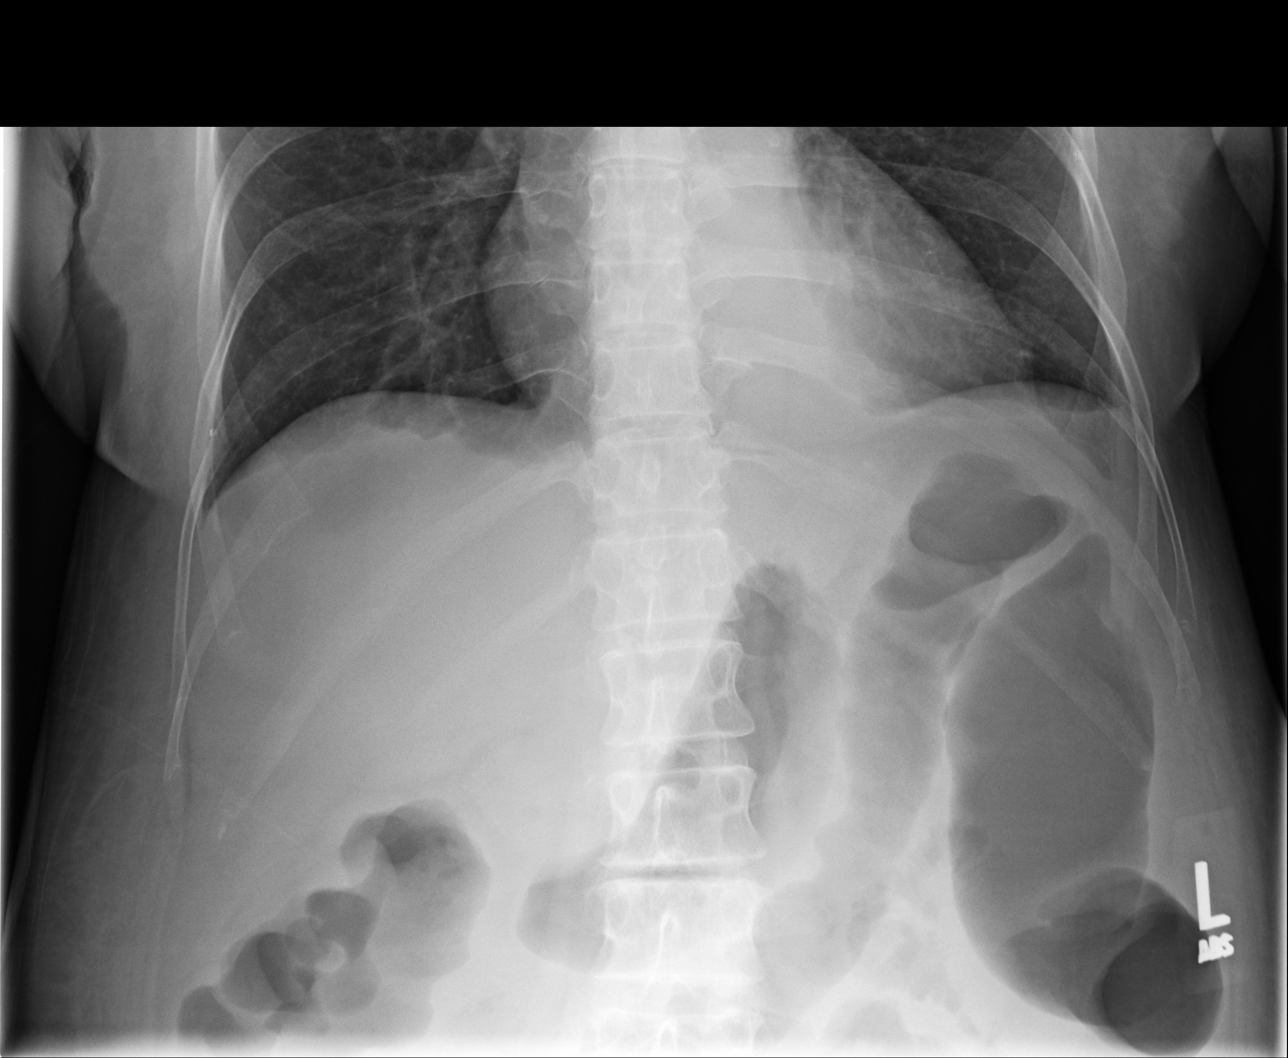

[4 of 4 positions shown; findings below may reference images not displayed]

PROCEDURE:     DXR - DXR ABDOMEN COMPLETE  - September 27, 2011  [DATE]

RESULT:     Flat, erect and lateral decubitus views of the abdomen were
obtained. Comparison is made to the prior exam of 09/25/2011. Again noted are
dilated loops of large and small bowel. The degree of bowel dilatation has
decreased since the prior exam. Scattered air-fluid levels are again noted.
The air-fluid levels appear less prominent than on the prior study. No
abnormal intraabdominal calcifications are seen. No acute bony abnormalities
are identified.
IMPRESSION: 1.     There persists dilatation of large and small bowel, improved as
compared to the exam of 09/25/2011.

## 2013-08-07 ENCOUNTER — Other Ambulatory Visit: Payer: Self-pay | Admitting: Internal Medicine

## 2013-09-02 ENCOUNTER — Ambulatory Visit: Payer: Medicare Other | Admitting: Internal Medicine

## 2013-09-07 ENCOUNTER — Encounter (INDEPENDENT_AMBULATORY_CARE_PROVIDER_SITE_OTHER): Payer: Self-pay

## 2013-09-07 ENCOUNTER — Encounter: Payer: Self-pay | Admitting: Internal Medicine

## 2013-09-07 ENCOUNTER — Ambulatory Visit (INDEPENDENT_AMBULATORY_CARE_PROVIDER_SITE_OTHER): Payer: Medicare Other | Admitting: Internal Medicine

## 2013-09-07 VITALS — BP 164/92 | HR 60 | Temp 98.4°F | Resp 16 | Wt 133.5 lb

## 2013-09-07 DIAGNOSIS — Z79899 Other long term (current) drug therapy: Secondary | ICD-10-CM | POA: Diagnosis not present

## 2013-09-07 DIAGNOSIS — E559 Vitamin D deficiency, unspecified: Secondary | ICD-10-CM | POA: Diagnosis not present

## 2013-09-07 DIAGNOSIS — R5383 Other fatigue: Secondary | ICD-10-CM

## 2013-09-07 DIAGNOSIS — R5381 Other malaise: Secondary | ICD-10-CM

## 2013-09-07 DIAGNOSIS — E785 Hyperlipidemia, unspecified: Secondary | ICD-10-CM

## 2013-09-07 DIAGNOSIS — E538 Deficiency of other specified B group vitamins: Secondary | ICD-10-CM

## 2013-09-07 DIAGNOSIS — I10 Essential (primary) hypertension: Secondary | ICD-10-CM

## 2013-09-07 LAB — COMPREHENSIVE METABOLIC PANEL
ALT: 15 U/L (ref 0–35)
AST: 23 U/L (ref 0–37)
Albumin: 4 g/dL (ref 3.5–5.2)
Alkaline Phosphatase: 55 U/L (ref 39–117)
BUN: 26 mg/dL — ABNORMAL HIGH (ref 6–23)
CO2: 28 mEq/L (ref 19–32)
Calcium: 9.1 mg/dL (ref 8.4–10.5)
Chloride: 101 mEq/L (ref 96–112)
Creatinine, Ser: 0.7 mg/dL (ref 0.4–1.2)
GFR: 95.75 mL/min (ref >60.00)
Glucose, Bld: 99 mg/dL (ref 70–99)
Potassium: 3.8 mEq/L (ref 3.5–5.1)
Sodium: 138 mEq/L (ref 135–145)
Total Bilirubin: 0.7 mg/dL (ref 0.3–1.2)
Total Protein: 6.9 g/dL (ref 6.0–8.3)

## 2013-09-07 LAB — CBC WITH DIFFERENTIAL/PLATELET
Basophils Absolute: 0 10*3/uL (ref 0.0–0.1)
Basophils Relative: 0.7 % (ref 0.0–3.0)
Eosinophils Absolute: 0 10*3/uL (ref 0.0–0.7)
Eosinophils Relative: 0.9 % (ref 0.0–5.0)
HCT: 40.9 % (ref 36.0–46.0)
Hemoglobin: 13.6 g/dL (ref 12.0–15.0)
Lymphocytes Relative: 35.5 % (ref 12.0–46.0)
Lymphs Abs: 1.6 10*3/uL (ref 0.7–4.0)
MCHC: 33.3 g/dL (ref 30.0–36.0)
MCV: 95.6 fl (ref 78.0–100.0)
Monocytes Absolute: 0.3 10*3/uL (ref 0.1–1.0)
Monocytes Relative: 7.1 % (ref 3.0–12.0)
Neutro Abs: 2.6 10*3/uL (ref 1.4–7.7)
Neutrophils Relative %: 55.8 % (ref 43.0–77.0)
Platelets: 253 10*3/uL (ref 150.0–400.0)
RBC: 4.28 Mil/uL (ref 3.87–5.11)
RDW: 13.3 % (ref 11.5–14.6)
WBC: 4.6 10*3/uL (ref 4.5–10.5)

## 2013-09-07 LAB — VITAMIN B12: Vitamin B-12: >1500 pg/mL — ABNORMAL HIGH (ref 211–911)

## 2013-09-07 LAB — LIPID PANEL
Cholesterol: 204 mg/dL — ABNORMAL HIGH (ref 0–200)
HDL: 76.5 mg/dL (ref >39.00)
LDL Cholesterol: 109 mg/dL — ABNORMAL HIGH (ref 0–99)
Total CHOL/HDL Ratio: 3
Triglycerides: 94 mg/dL (ref 0.0–149.0)
VLDL: 18.8 mg/dL (ref 0.0–40.0)

## 2013-09-07 NOTE — Patient Instructions (Signed)
Your blood pressure is elevated today.  Please resume a full tablet of losartan daily  Let me know if your home blood pressures are > 140 or less than 100 on  The full dose of losartan  You can take "Hair skin and Nails" instead of a multivitamin.  Managing Your High Blood Pressure Blood pressure is a measurement of how forceful your blood is pressing against the walls of the arteries. Arteries are muscular tubes within the circulatory system. Blood pressure does not stay the same. Blood pressure rises when you are active, excited, or nervous; and it lowers during sleep and relaxation. If the numbers measuring your blood pressure stay above normal most of the time, you are at risk for health problems. High blood pressure (hypertension) is a long-term (chronic) condition in which blood pressure is elevated. A blood pressure reading is recorded as two numbers, such as 120 over 80 (or 120/80). The first, higher number is called the systolic pressure. It is a measure of the pressure in your arteries as the heart beats. The second, lower number is called the diastolic pressure. It is a measure of the pressure in your arteries as the heart relaxes between beats.  Keeping your blood pressure in a normal range is important to your overall health and prevention of health problems, such as heart disease and stroke. When your blood pressure is uncontrolled, your heart has to work harder than normal. High blood pressure is a very common condition in adults because blood pressure tends to rise with age. Men and women are equally likely to have hypertension but at different times in life. Before age 11, men are more likely to have hypertension. After 70 years of age, women are more likely to have it. Hypertension is especially common in African Americans. This condition often has no signs or symptoms. The cause of the condition is usually not known. Your caregiver can help you come up with a plan to keep your blood  pressure in a normal, healthy range. BLOOD PRESSURE STAGES Blood pressure is classified into four stages: normal, prehypertension, stage 1, and stage 2. Your blood pressure reading will be used to determine what type of treatment, if any, is necessary. Appropriate treatment options are tied to these four stages:  Normal  Systolic pressure (mm Hg): below 120.  Diastolic pressure (mm Hg): below 80. Prehypertension  Systolic pressure (mm Hg): 120 to 139.  Diastolic pressure (mm Hg): 80 to 89. Stage1  Systolic pressure (mm Hg): 140 to 159.  Diastolic pressure (mm Hg): 90 to 99. Stage2  Systolic pressure (mm Hg): 160 or above.  Diastolic pressure (mm Hg): 100 or above. RISKS RELATED TO HIGH BLOOD PRESSURE Managing your blood pressure is an important responsibility. Uncontrolled high blood pressure can lead to:  A heart attack.  A stroke.  A weakened blood vessel (aneurysm).  Heart failure.  Kidney damage.  Eye damage.  Metabolic syndrome.  Memory and concentration problems. HOW TO MANAGE YOUR BLOOD PRESSURE Blood pressure can be managed effectively with lifestyle changes and medicines (if needed). Your caregiver will help you come up with a plan to bring your blood pressure within a normal range. Your plan should include the following: Education  Read all information provided by your caregivers about how to control blood pressure.  Educate yourself on the latest guidelines and treatment recommendations. New research is always being done to further define the risks and treatments for high blood pressure. Lifestylechanges  Control your weight.  Avoid  smoking.  Stay physically active.  Reduce the amount of salt in your diet.  Reduce stress.  Control any chronic conditions, such as high cholesterol or diabetes.  Reduce your alcohol intake. Medicines  Several medicines (antihypertensive medicines) are available, if needed, to bring blood pressure within a  normal range. Communication  Review all the medicines you take with your caregiver because there may be side effects or interactions.  Talk with your caregiver about your diet, exercise habits, and other lifestyle factors that may be contributing to high blood pressure.  See your caregiver regularly. Your caregiver can help you create and adjust your plan for managing high blood pressure. RECOMMENDATIONS FOR TREATMENT AND FOLLOW-UP  The following recommendations are based on current guidelines for managing high blood pressure in nonpregnant adults. Use these recommendations to identify the proper follow-up period or treatment option based on your blood pressure reading. You can discuss these options with your caregiver.  Systolic pressure of 009 to 381 or diastolic pressure of 80 to 89: Follow up with your caregiver as directed.  Systolic pressure of 829 to 937 or diastolic pressure of 90 to 100: Follow up with your caregiver within 2 months.  Systolic pressure above 169 or diastolic pressure above 678: Follow up with your caregiver within 1 month.  Systolic pressure above 938 or diastolic pressure above 101: Consider antihypertensive therapy; follow up with your caregiver within 1 week.  Systolic pressure above 751 or diastolic pressure above 025: Begin antihypertensive therapy; follow up with your caregiver within 1 week. Document Released: 02/06/2012 Document Reviewed: 02/06/2012 [facility] Patient Information 2014 [place], [place].

## 2013-09-07 NOTE — Assessment & Plan Note (Signed)
Checking for b12 and folate deficiency.

## 2013-09-07 NOTE — Assessment & Plan Note (Signed)
Managed with generic lipitor .,  lfts and fasting lipids are due

## 2013-09-07 NOTE — Progress Notes (Signed)
Patient ID: Kaitlyn Roman, female   DOB: 19-Feb-1946, 68 y.o.   MRN: [id number]  Patient Active Problem List   Diagnosis Date Noted  . Other malaise and fatigue 09/07/2013  . Alopecia 08/20/2012  . Muscle cramps at night 01/26/2012  . OA (osteoarthritis) 08/28/2011  . Polymyalgia rheumatica 08/28/2011  . Neuropathy 08/28/2011  . Hyperlipidemia   . Hypertension     Subjective:  CC:   Chief Complaint  Patient presents with  . Follow-up    HTN patient is fasting would like fasting labs and B 12    HPI:   Kaitlyn Roman is a 68 y.o. female who presents for  Follow up on multiple chronic conditions.  Still exercising at the gy regularly but has gained a few lbs.  Spent 3 weeks in [place] avoiding cold weather in February. No diarrhea.  Taking probiotic daily some joint pain due to OA changes of of thumb and both knees,  Discussed utility og MVI,s  Co Q 10,  Taking naproxen daily instead of meloxicam  No longer using tramadol.  Hair loss has improved.   Fatigue.  Mor lack of motivation to clean out closets.  Denies exertional dyspne,a  Chest pain,  Change in routine.  Sleeps intermittently , chronially with frequent wakenings,   Hypertension: only taking 1/2 of losartan bc in october her bp was fine on 1/2 tablet.     Past Medical History  Diagnosis Date  . Polymyalgia rheumatica syndrome     rheumatoloigst Dr Zenia Resides at [facility]  . Hyperlipidemia   . Hypertension   . Colitis, infectious April 2013     C dificile colitis, hosp [facility] April 2013 x 2 weeks    Past Surgical History  Procedure Laterality Date  . Abdominal hysterectomy    . Craniectomy for excision of acoustic neuroma         The following portions of the patient's history were reviewed and updated as appropriate: Allergies, current medications, and problem list.    Review of Systems:   Patient denies headache, fevers, malaise, unintentional weight loss, skin rash, eye pain, sinus congestion and sinus  pain, sore throat, dysphagia,  hemoptysis , cough, dyspnea, wheezing, chest pain, palpitations, orthopnea, edema, abdominal pain, nausea, melena, diarrhea, constipation, flank pain, dysuria, hematuria, urinary  Frequency, nocturia, numbness, tingling, seizures,  Focal weakness, Loss of consciousness,  Tremor, insomnia, depression, anxiety, and suicidal ideation.     History   Social History  . Marital Status: Married    Spouse Name: N/A    Number of Children: N/A  . Years of Education: N/A   Occupational History  . Not on file.   Social History Main Topics  . Smoking status: Never Smoker   . Smokeless tobacco: Never Used  . Alcohol Use: Yes     Comment: one cocctail nightly   . Drug Use: No  . Sexual Activity: Not on file   Other Topics Concern  . Not on file   Social History Narrative  . No narrative on file    Objective:  Filed Vitals:   09/07/13 0934  BP: 164/92  Pulse: 60  Temp: 98.4 F (36.9 C)  Resp: 16     General appearance: alert, cooperative and appears stated age Ears: normal TM's and external ear canals both ears Throat: lips, mucosa, and tongue normal; teeth and gums normal Neck: no adenopathy, no carotid bruit, supple, symmetrical, trachea midline and thyroid not enlarged, symmetric, no tenderness/mass/nodules Back: symmetric, no curvature. ROM normal.  No CVA tenderness. Lungs: clear to auscultation bilaterally Heart: regular rate and rhythm, S1, S2 normal, no murmur, click, rub or gallop Abdomen: soft, non-tender; bowel sounds normal; no masses,  no organomegaly Pulses: 2+ and symmetric Skin: Skin color, texture, turgor normal. No rashes or lesions Lymph nodes: Cervical, supraclavicular, and axillary nodes normal.  Assessment and Plan:  Hypertension Elevated today.  Reviewed list of meds, patient is  taking naproxen  Daily and has reduced her losartan to half tablet. I have asked patient to resume full dose and check bp at home a minimum of 5  times over the next 4 weeks and call readings to office for adjustment of medications.    Hyperlipidemia Managed with generic lipitor .,  lfts and fasting lipids are due   Other malaise and fatigue Checking for b12 and folate deficiency.    Updated Medication List Outpatient Encounter Prescriptions as of 09/07/2013  Medication Sig  . aspirin 81 MG tablet Take 81 mg by mouth daily.  [name] atorvastatin (LIPITOR) 40 MG tablet TAKE 1 TABLET EVERY DAY  . Cholecalciferol (VITAMIN D-3) 1000 UNITS CAPS Take 1 capsule by mouth daily.  [name] doxylamine, Sleep, (UNISOM) 25 MG tablet Take 12.5 mg by mouth at bedtime as needed for sleep.   [name] estradiol (ESTRACE) 0.5 MG tablet Take 0.5 tablets (0.25 mg total) by mouth daily.  [name] losartan (COZAAR) 100 MG tablet TAKE 1 TABLET EVERY DAY  . naproxen sodium (ANAPROX) 220 MG tablet Take 220 mg by mouth 2 (two) times daily with a meal.  . Omega-3 Fatty Acids (FISH OIL) 1000 MG CAPS Take 1 capsule by mouth daily.  . TDaP (BOOSTRIX) 5-2.5-18.5 LF-MCG/0.5 injection Inject 0.5 mLs into the muscle once.  . [DISCONTINUED] meloxicam (MOBIC) 15 MG tablet Take 1 tablet (15 mg total) by mouth daily.  . [DISCONTINUED] meloxicam (MOBIC) 15 MG tablet Take 1 tablet (15 mg total) by mouth daily.  . [DISCONTINUED] traMADol (ULTRAM) 50 MG tablet Take 50 mg by mouth every 6 (six) hours as needed.    . Biotin 10 MG CAPS Take by mouth.  . Multiple Vitamins-Minerals (HAIR/SKIN/NAILS PO) Take by mouth.  . ondansetron (ZOFRAN) 4 MG tablet Take 4 mg by mouth every 8 (eight) hours as needed.     Orders Placed This Encounter  Procedures  . Comprehensive metabolic panel  . Lipid panel  . Vitamin B12  . Folate RBC  . CBC with Differential  . Vit D  25 hydroxy (rtn osteoporosis monitoring)    No Follow-up on file.

## 2013-09-07 NOTE — Progress Notes (Signed)
Pre-visit discussion using our clinic review tool. No additional management support is needed unless otherwise documented below in the visit note.  

## 2013-09-07 NOTE — Assessment & Plan Note (Signed)
Elevated today.  Reviewed list of meds, patient is  taking naproxen  Daily and has reduced her losartan to half tablet. I have asked patient to resume full dose and check bp at home a minimum of 5 times over the next 4 weeks and call readings to office for adjustment of medications.

## 2013-09-08 ENCOUNTER — Telehealth: Payer: Self-pay | Admitting: Internal Medicine

## 2013-09-08 LAB — VITAMIN D 25 HYDROXY (VIT D DEFICIENCY, FRACTURES): Vit D, 25-Hydroxy: 58 ng/mL (ref 30–89)

## 2013-09-08 LAB — FOLATE RBC: RBC Folate: 585 ng/mL (ref >280)

## 2013-09-08 NOTE — Telephone Encounter (Signed)
Relevant patient education mailed to patient.  

## 2013-09-09 ENCOUNTER — Encounter: Payer: Self-pay | Admitting: *Deleted

## 2013-09-30 DIAGNOSIS — L723 Sebaceous cyst: Secondary | ICD-10-CM | POA: Diagnosis not present

## 2013-09-30 DIAGNOSIS — D1801 Hemangioma of skin and subcutaneous tissue: Secondary | ICD-10-CM | POA: Diagnosis not present

## 2013-09-30 DIAGNOSIS — L819 Disorder of pigmentation, unspecified: Secondary | ICD-10-CM | POA: Diagnosis not present

## 2013-10-05 ENCOUNTER — Ambulatory Visit (INDEPENDENT_AMBULATORY_CARE_PROVIDER_SITE_OTHER): Payer: Medicare Other | Admitting: Adult Health

## 2013-10-05 ENCOUNTER — Encounter: Payer: Self-pay | Admitting: Adult Health

## 2013-10-05 VITALS — BP 124/78 | HR 78 | Temp 99.9°F | Resp 14 | Wt 129.8 lb

## 2013-10-05 DIAGNOSIS — J069 Acute upper respiratory infection, unspecified: Secondary | ICD-10-CM | POA: Diagnosis not present

## 2013-10-05 MED ORDER — GUAIFENESIN-CODEINE 100-10 MG/5ML PO SOLN: 5.0000 mL | Freq: Three times a day (TID) | ORAL | Status: DC | PRN

## 2013-10-05 NOTE — Progress Notes (Signed)
Pre visit review using our clinic review tool, if applicable. No additional management support is needed unless otherwise documented below in the visit note. 

## 2013-10-05 NOTE — Progress Notes (Signed)
Patient ID: Kaitlyn Roman, female   DOB: 07/26/1945, 68 y.o.   MRN: [id number]    Subjective:    Patient ID: Kaitlyn Roman, female    DOB: 1946-05-07, 68 y.o.   MRN: [id number]  HPI  Pt is a 68 y/o female who presents to clinic with congestion, cough that is "stuck" and can't bring anything up, post nasal drip. She denies fever, chills. She has been taking guaifenesin and dextromethorphan. Cough keeps her up during the night.   Past Medical History  Diagnosis Date  . Polymyalgia rheumatica syndrome     rheumatoloigst Dr Zenia Resides at [facility]  . Hyperlipidemia   . Hypertension   . Colitis, infectious April 2013     C dificile colitis, hosp [facility] April 2013 x 2 weeks    Current Outpatient Prescriptions on File Prior to Visit  Medication Sig Dispense Refill  . aspirin 81 MG tablet Take 81 mg by mouth daily.      [name] atorvastatin (LIPITOR) 40 MG tablet TAKE 1 TABLET EVERY DAY  90 tablet  0  . Cholecalciferol (VITAMIN D-3) 1000 UNITS CAPS Take 1 capsule by mouth daily.      [name] doxylamine, Sleep, (UNISOM) 25 MG tablet Take 12.5 mg by mouth at bedtime as needed for sleep.       [name] estradiol (ESTRACE) 0.5 MG tablet Take 0.5 tablets (0.25 mg total) by mouth daily.  90 tablet  1  . losartan (COZAAR) 100 MG tablet TAKE 1 TABLET EVERY DAY  90 tablet  0  . naproxen sodium (ANAPROX) 220 MG tablet Take 220 mg by mouth 2 (two) times daily with a meal.      . Omega-3 Fatty Acids (FISH OIL) 1000 MG CAPS Take 1 capsule by mouth daily.      . TDaP (BOOSTRIX) 5-2.5-18.5 LF-MCG/0.5 injection Inject 0.5 mLs into the muscle once.  0.5 mL  0   No current facility-administered medications on file prior to visit.    Review of Systems  Constitutional: Negative for fever and chills.  HENT: Positive for congestion (nasal congestion), postnasal drip, rhinorrhea and sinus pressure. Negative for sore throat.   Respiratory: Positive for cough and chest tightness. Negative for wheezing.   All other systems  reviewed and are negative.      Objective:  There were no vitals taken for this visit.   Physical Exam  Constitutional: She is oriented to person, place, and time. She appears well-developed and well-nourished. No distress.  HENT:  Head: Normocephalic and atraumatic.  Right Ear: External ear normal.  Left Ear: External ear normal.  Mouth/Throat: No oropharyngeal exudate.  Cardiovascular: Normal rate, regular rhythm and normal heart sounds.  Exam reveals no gallop.   No murmur heard. Pulmonary/Chest: Effort normal and breath sounds normal. No respiratory distress. She has no wheezes. She has no rales.  Lymphadenopathy:    She has no cervical adenopathy.  Neurological: She is alert and oriented to person, place, and time.  Psychiatric: She has a normal mood and affect. Her behavior is normal. Judgment and thought content normal.      Assessment & Plan:   1. URI (upper respiratory infection) Appears viral. Start over the counter Afrin for severe nasal congestion. Use only 3 days. Robitussin AC for severe cough. See pt instructions for full POC.

## 2013-10-05 NOTE — Patient Instructions (Signed)
  For nasal congestion, use Afrin severe sinus congestion twice a day for 3 days only. You can purchase this over the counter.  Use saline spray (ocean spray) to irrigate your sinuses. This is plain salt water and you can use this as often as you like.  Continue to use the humidifier.  Guaifenesin is a cough expectorant that will help liquify your secretions and make it easier to cough up.  I am giving you a prescription for Robitussin AC which has guaifenesin and codeine. The codeine will help calm the cough and allow you to sleep if the cough is keeping you up.   During the day you can take the plain guaifenesin cough syrup you have.  Take tylenol for any general discomfort, headache or low grade fever.  Please call if you develop any shortness of breath, your secretions become thick and green colored or if you develop a fever of 101 or greater.

## 2013-11-17 IMAGING — CR DG WRIST COMPLETE 3+V*R*
2 series · 2 of 2 positions shown · non-contrast
Comparison: None.

CLINICAL DATA: History of pain at the base of the thumb.  Wrist
pain.  No history of trauma.

RIGHT WRIST - COMPLETE 3+ VIEW

[view not recorded (1 of 2)]
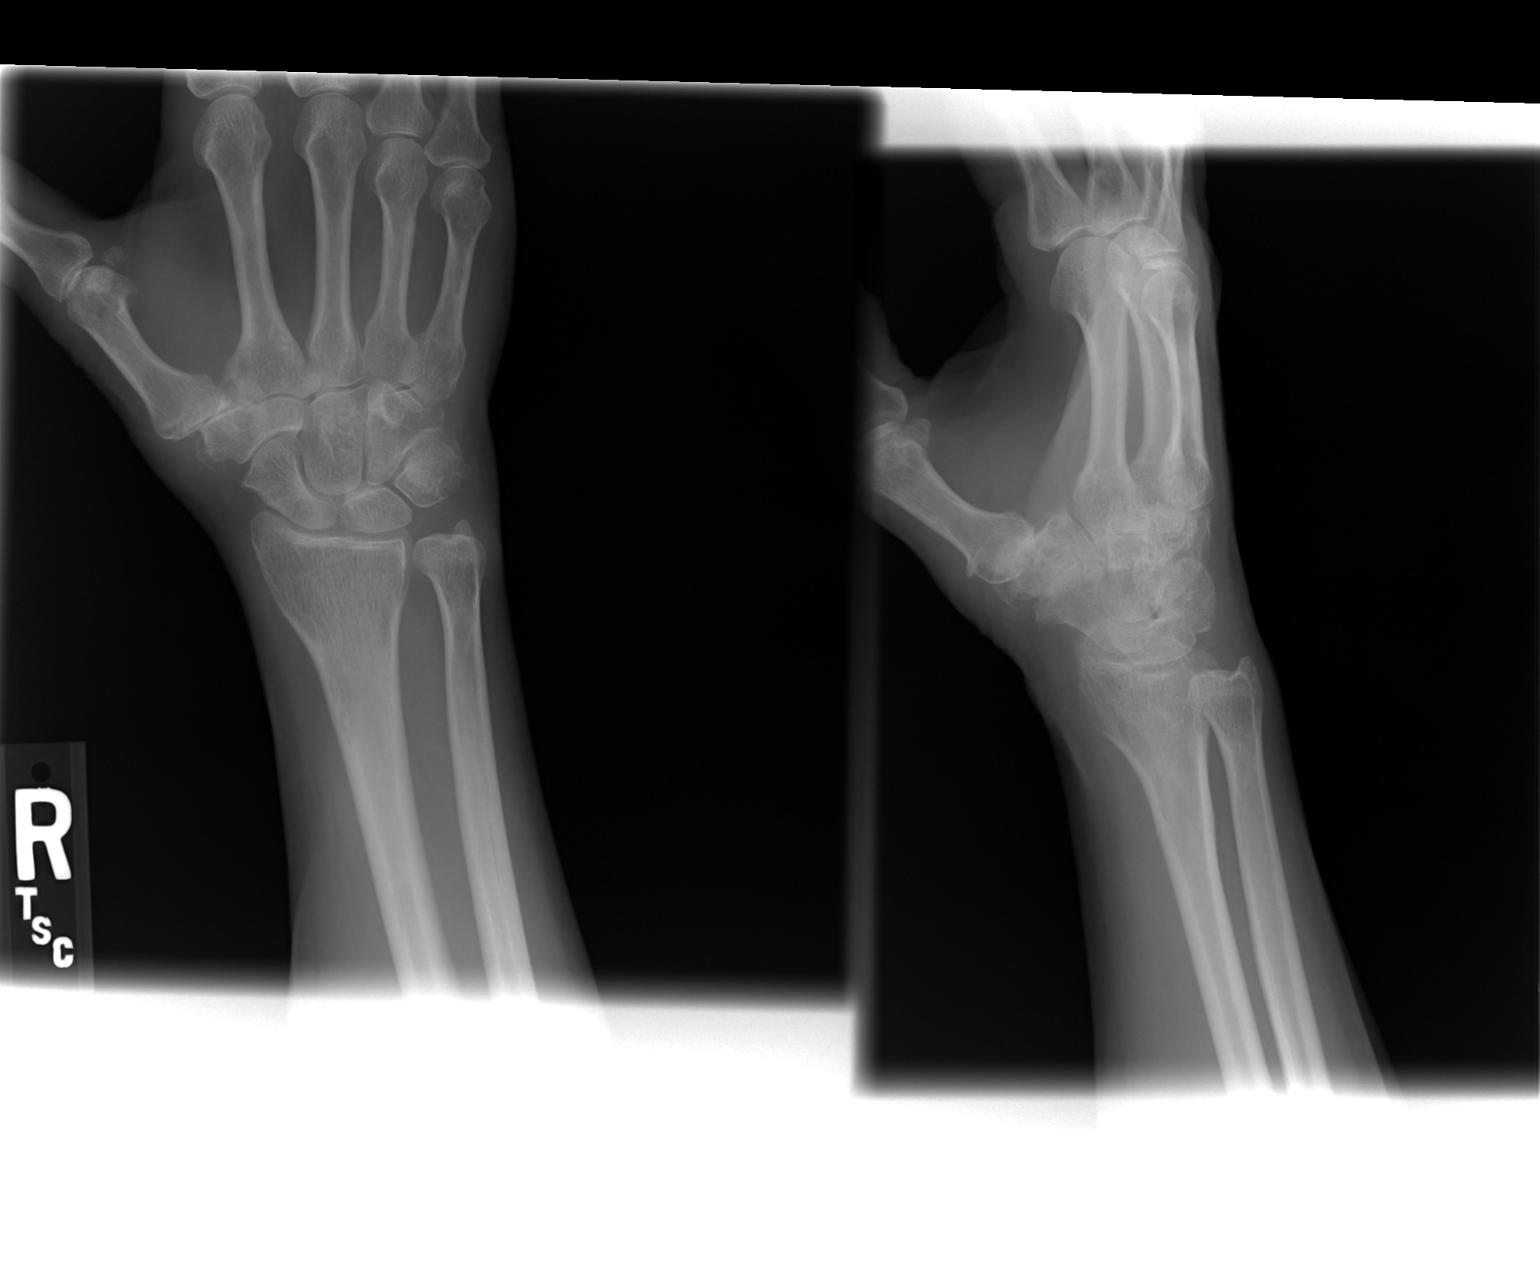

[view not recorded (2 of 2)]
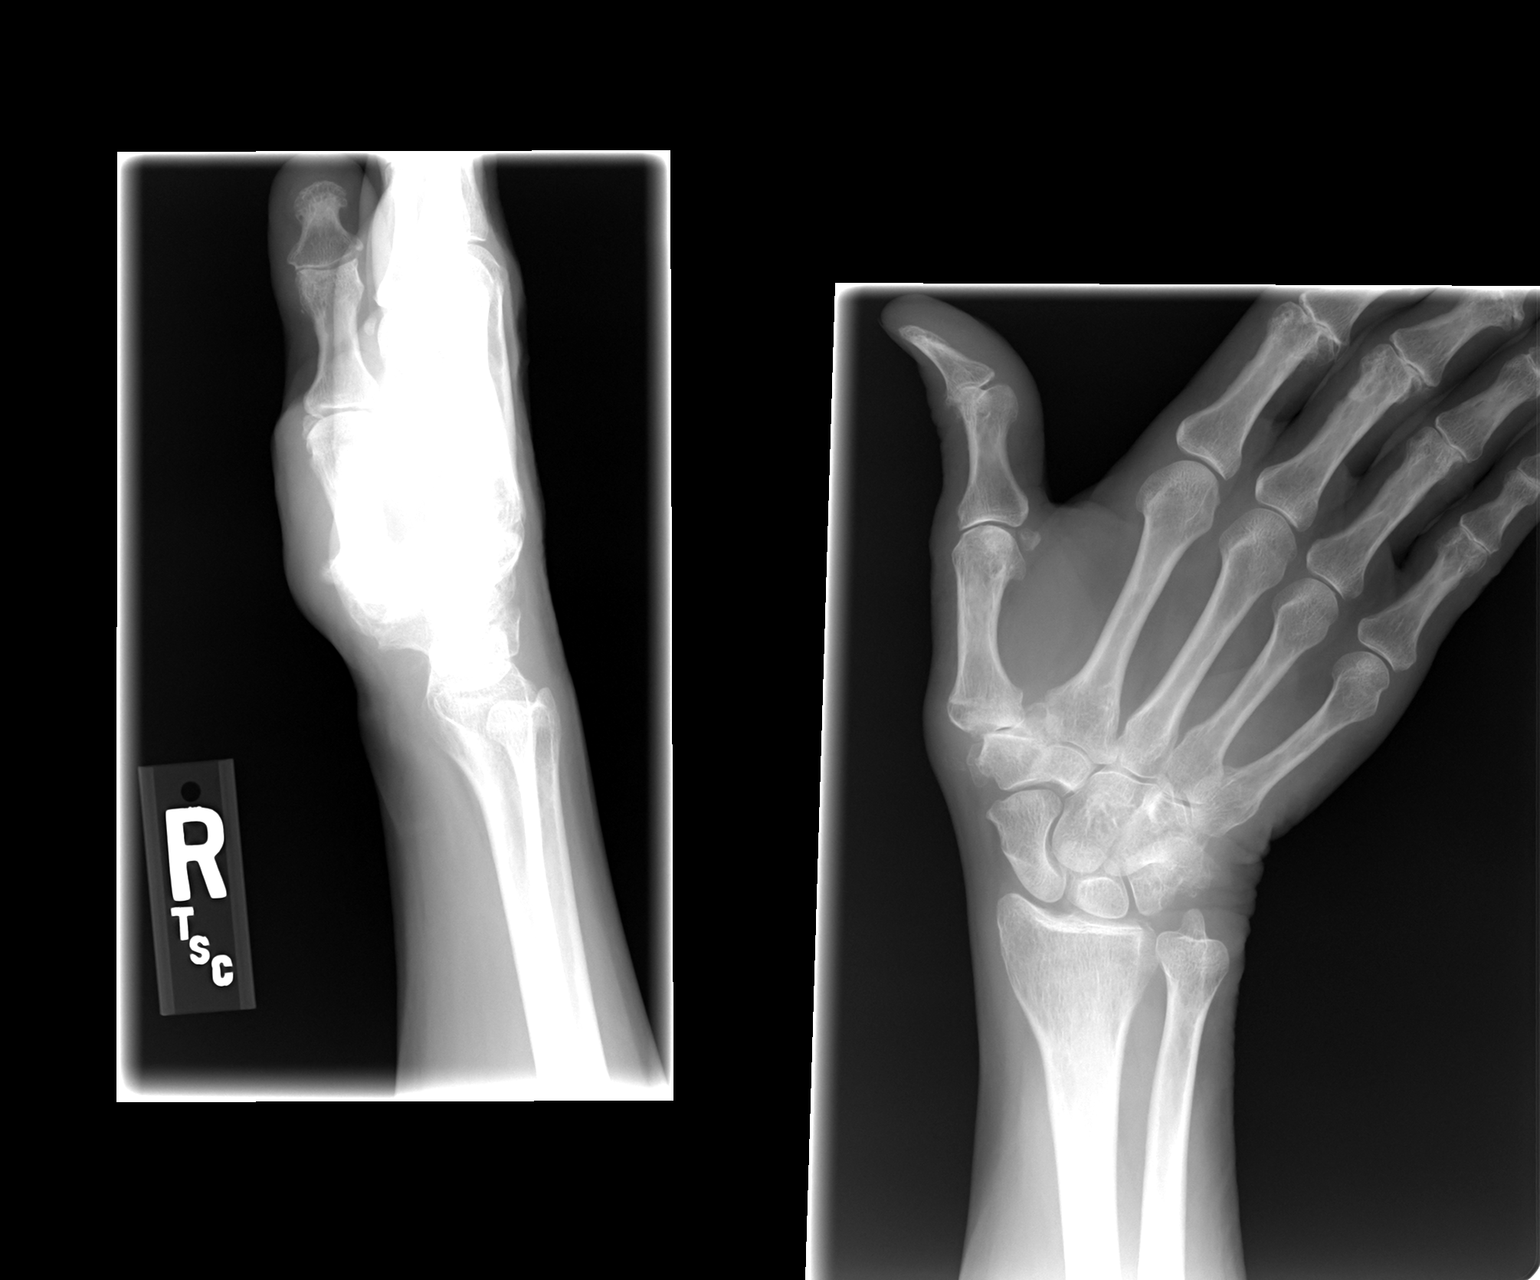

[2 of 2 positions shown; findings below may reference images not displayed]

FINDINGS: There is degenerative change involving the trapezium -
first metacarpal joint where the patient is painful.  There is
narrowing of the joint space with slight cortical sclerosis and
minimal spurring.  No fracture, bony destruction, erosive change,
or other lesion is seen.  Scaphoid appear intact.  There is a
slightly osteopenic appearance of bones.
IMPRESSION: Degenerative osteoarthritic change involving trapezium - first
metacarpal joint.  Slightly osteopenic appearance of the bones.

## 2014-02-22 ENCOUNTER — Telehealth: Payer: Self-pay | Admitting: Internal Medicine

## 2014-02-22 DIAGNOSIS — Z79899 Other long term (current) drug therapy: Secondary | ICD-10-CM

## 2014-02-22 MED ORDER — ATORVASTATIN CALCIUM 40 MG PO TABS: ORAL_TABLET | ORAL | Status: DC

## 2014-02-22 MED ORDER — ESTRADIOL 0.5 MG PO TABS: 0.2500 mg | ORAL_TABLET | Freq: Every day | ORAL | Status: DC

## 2014-02-22 MED ORDER — LOSARTAN POTASSIUM 100 MG PO TABS: ORAL_TABLET | ORAL | Status: DC

## 2014-02-22 MED ORDER — MELOXICAM 15 MG PO TABS: 15.0000 mg | ORAL_TABLET | Freq: Every day | ORAL | Status: DC

## 2014-02-22 NOTE — Telephone Encounter (Signed)
Patient notified and lab appointment set. 

## 2014-02-22 NOTE — Telephone Encounter (Signed)
Patient requesting refills on all medications need authorization for Meloxicam. Patient ask to please advise if any labs needed Last OV 4/15 and last labs $/15 patient request 90 day supply to [facility].

## 2014-02-22 NOTE — Telephone Encounter (Signed)
I refilled all meds including meloxicam with mail order.  Advise her : DO NOTuse naproxen and meloxxicam on same day., one or th other ok.    She needs a nonfasting CMEt in October.  Lab ordered.

## 2014-02-25 ENCOUNTER — Telehealth: Payer: Self-pay | Admitting: *Deleted

## 2014-02-25 NOTE — Telephone Encounter (Signed)
Fax from pharmacy, needing PA for Estradiol. Started online, approved, pharmacy notified.

## 2014-02-26 DIAGNOSIS — H18832 Recurrent erosion of cornea, left eye: Secondary | ICD-10-CM | POA: Diagnosis not present

## 2014-02-26 DIAGNOSIS — H16132 Photokeratitis, left eye: Secondary | ICD-10-CM | POA: Diagnosis not present

## 2014-03-02 ENCOUNTER — Telehealth: Payer: Self-pay | Admitting: *Deleted

## 2014-03-02 ENCOUNTER — Other Ambulatory Visit (INDEPENDENT_AMBULATORY_CARE_PROVIDER_SITE_OTHER): Payer: Medicare Other

## 2014-03-02 ENCOUNTER — Ambulatory Visit: Payer: Medicare Other

## 2014-03-02 DIAGNOSIS — Z23 Encounter for immunization: Secondary | ICD-10-CM

## 2014-03-02 DIAGNOSIS — Z79899 Other long term (current) drug therapy: Secondary | ICD-10-CM | POA: Diagnosis not present

## 2014-03-02 DIAGNOSIS — L659 Nonscarring hair loss, unspecified: Secondary | ICD-10-CM

## 2014-03-02 LAB — COMPREHENSIVE METABOLIC PANEL
ALT: 16 U/L (ref 0–35)
AST: 26 U/L (ref 0–37)
Albumin: 4.4 g/dL (ref 3.5–5.2)
Alkaline Phosphatase: 63 U/L (ref 39–117)
BUN: 17 mg/dL (ref 6–23)
CO2: 29 mEq/L (ref 19–32)
Calcium: 9.5 mg/dL (ref 8.4–10.5)
Chloride: 102 mEq/L (ref 96–112)
Creatinine, Ser: 0.7 mg/dL (ref 0.4–1.2)
GFR: 93.94 mL/min (ref >60.00)
Glucose, Bld: 86 mg/dL (ref 70–99)
Potassium: 4.6 mEq/L (ref 3.5–5.1)
Sodium: 140 mEq/L (ref 135–145)
Total Bilirubin: 0.6 mg/dL (ref 0.2–1.2)
Total Protein: 7.8 g/dL (ref 6.0–8.3)

## 2014-03-02 NOTE — Telephone Encounter (Signed)
Yes, did it tahks

## 2014-03-02 NOTE — Telephone Encounter (Signed)
Pt would like to add tsh 

## 2014-03-03 ENCOUNTER — Encounter: Payer: Self-pay | Admitting: *Deleted

## 2014-03-03 ENCOUNTER — Other Ambulatory Visit (INDEPENDENT_AMBULATORY_CARE_PROVIDER_SITE_OTHER): Payer: Medicare Other

## 2014-03-03 DIAGNOSIS — L659 Nonscarring hair loss, unspecified: Secondary | ICD-10-CM | POA: Diagnosis not present

## 2014-03-03 LAB — TSH: TSH: 2.55 u[IU]/mL (ref 0.35–4.50)

## 2014-03-04 ENCOUNTER — Encounter: Payer: Self-pay | Admitting: *Deleted

## 2014-05-05 DIAGNOSIS — Z1231 Encounter for screening mammogram for malignant neoplasm of breast: Secondary | ICD-10-CM | POA: Diagnosis not present

## 2014-05-05 LAB — HM MAMMOGRAPHY

## 2014-05-06 ENCOUNTER — Encounter: Payer: Self-pay | Admitting: *Deleted

## 2014-05-20 ENCOUNTER — Encounter: Payer: Self-pay | Admitting: Internal Medicine

## 2014-05-24 ENCOUNTER — Telehealth: Payer: Self-pay | Admitting: Internal Medicine

## 2014-05-24 MED ORDER — FLUTICASONE PROPIONATE 50 MCG/ACT NA SUSP: 2.0000 | Freq: Every day | NASAL | Status: AC

## 2014-05-24 NOTE — Telephone Encounter (Signed)
Patient informed that the prescription was sent in to [facility].

## 2014-05-24 NOTE — Telephone Encounter (Signed)
Patient would like to know if the doctor will call in a prescription for Sluticasone Protionate 57mcg to treat her allergies. The prescription can be called into [facility] on [address].

## 2014-05-24 NOTE — Telephone Encounter (Signed)
rx seton to [facility]

## 2014-07-21 DIAGNOSIS — L821 Other seborrheic keratosis: Secondary | ICD-10-CM | POA: Diagnosis not present

## 2014-07-21 DIAGNOSIS — D485 Neoplasm of uncertain behavior of skin: Secondary | ICD-10-CM | POA: Diagnosis not present

## 2014-07-21 DIAGNOSIS — L980 Pyogenic granuloma: Secondary | ICD-10-CM | POA: Diagnosis not present

## 2014-09-19 NOTE — Consult Note (Signed)
Pt with 4 loose stools since midnight.  no abd pain, appetite good, discussed plans with her and husband.  Stool collected yesterday is neg for C. diff.  Bx pending.  Plan to stop Dificid after today,  plan to wait on bx.  Tentative plans for stool transplant Wednesday if no improvement before then.  I explained that this is in the realm of experimental medicine with the backing of recent articles and lectures on the use of stool transplant.  The possible effects of her PMR disease and new bacteria via stool transplant are unknown.  We are running out of options at this point.  No response yet to her Entocort.  She is eating well and spirits seem up some after move to new room.    Electronic Signatures: Manya Silvas (MD)  (Signed on 12-May-13 11:26)  Authored  Last Updated: 12-May-13 11:26 by Manya Silvas (MD)

## 2014-09-19 NOTE — Consult Note (Signed)
PATIENT NAME:  Kaitlyn Roman, Kaitlyn Roman MR#:  [id number] DATE OF BIRTH:  Oct 10, 1945  DATE OF CONSULTATION:  09/20/2011  REFERRING PHYSICIAN:   CONSULTING PHYSICIAN:  Payton Emerald, NP  PRIMARY CARE PHYSICIAN: Dr. Derrel Nip   REASON FOR CONSULTATION: Pancolitis.   HISTORY OF PRESENT ILLNESS: Ms. Glab is a 69 year old Caucasian female who presented to see Dr. Derrel Nip yesterday in follow up as she had been treated for suspected C. difficile colitis with test result being negative. History is obtained from patient as well as her husband who is present during interviewing process. Patient started with upper respiratory congestion which did not improve. Onset of symptoms was 04/01. Dr. Derrel Nip according to her husband thought that it was just a virus, with her not getting better placed her on Augmentin which she completed around 04/11. She still was not feeling well, experiencing nausea low-grade fever. A different antibiotic was prescribed though patient states she did not take it. On 04/14 patient continued with the nausea, onset of abdominal pain, diarrhea. This continued until Saturday and presented to the Emergency Room where she was given 2 liters of normal saline. She was found to be hypotensive, 74/40. Stool study was done as previously noted for C. difficile colitis which was negative. She was discharged home with Flagyl but she only took three tablets. The next night she was back in the Emergency Room. She was given more IV hydration and thought that it was possibly a virus. Abdominal bloating and distention continued. She presented home Tuesday of this week and again was seen by Dr. Derrel Nip yesterday. Normal bowel pattern was once a day prior to the onset of this noted change. Bowels are now moving 3 to 4 times a day as well as experiencing nocturnal bowel movements. Mucus being present, yellow-colored stool. One episode of fecal incontinence. After being notified that her stool study was negative for C.  difficile ER physician advised that she was able to take Imodium. She had restarted Imodium. Husband is unable to elaborate the exact amount but possibly 2 to 4 tablets up until Tuesday morning.   Denies symptoms similar to this in the past. Only antibiotic use recently has been as stated above. No foreign travel. Travel to [place] a month ago, was in [place] with the onset of symptoms. No fresh water exposure. No exotic animal exposure. No ill contact exposure. Patient did have a colonoscopy performed by Dr. Gaylyn Cheers on 12/22/2003 and one polyp was resected from ascending colon. Pathology report was adenomatous. She has not had a colonoscopy done since that time. CT scan of abdomen and pelvis was done without contrast on 09/19/2011 which revealed that there was colonic wall thickening involving the cecum, ascending colon, transverse colon, descending sigmoid with pericolonic inflammatory changes. A small amount of free fluid in the bilateral paracolic gutters within the pelvis. Patient since being admitted and is currently receiving metronidazole 500 mg IV as well as ciprofloxacin 400 mg every 12 hours. She is unable to state that she is really starting to feel better.   PAST MEDICAL HISTORY:  1. History of PMR diagnosed 4 to 5 years ago, was on prednisone, stopped. 2. Left ear acoustic neuroma status post surgery in 1993.  3. Hypertension. 4. Hyperlipidemia. 5. Personal history of colonic polyps, adenomatous, colonoscopy 2005.   PAST SURGICAL HISTORY:  1. Status post acoustic neuroma resection. 2. Status post hysterectomy.  3. Status post right cataract surgery.  4. Colonoscopy 11/2003.   ALLERGIES: None.  HOME MEDICATIONS:  1. Meloxicam 50 mg 1 tablet p.o. daily, which she has not taken for the past week due to being sick. 2. Tramadol as directed as needed.  3. Losartan 100 mg daily which she also stopped a week ago. 4. Atorvastatin 20 mg daily, which has also been held for the  past week.  5. Aspirin 81 mg once a day.   SOCIAL HISTORY: No tobacco use. 8 to 9 glasses of wine a week. No recreational drug use.   FAMILY HISTORY: No family history of inflammatory bowel disease. No colon cancer. No history of colonic polyps, celiac disease. Father history of coronary artery disease.    REVIEW OF SYSTEMS: CONSTITUTIONAL: Significant for low-grade fever, fatigue, weakness, abdominal pain. No significant weight loss or weight gain. EYES: No double vision, blurred vision. No pain, redness, inflammation, glaucoma. Does have cataracts. ENT: No tinnitus. No ear pain, hearing loss, allergies seasonal or year-round. No epistaxis, nasal discharge, snoring, postnasal drainage. RESPIRATORY: Recently treated for upper respiratory infection. Cough has resolved. No wheezing. No hemoptysis. No history of chronic obstructive pulmonary disease, pneumonia. CARDIOVASCULAR: No chest pain, arrhythmias. GASTROINTESTINAL: See history of present illness. GENITOURINARY: No dysuria, hematuria, renal calculi or frequency. ENDOCRINE: No polydipsia, nocturia, thyroid problems. HEME/LYMPH: No history of anemia, easy bruising or bleeding. MUSCULOSKELETAL: Significant for arthralgias. History of PMR. No transient ischemic attack or seizures. PSYCH: No depression. No anxiety.   PHYSICAL EXAMINATION:  VITAL SIGNS: Temperature 98.8, pulse 66, respirations 18, blood pressure 108/67, pulse oximetry 97%.   GENERAL: Well developed, well nourished 69 year old Caucasian female who does appear moderate distress, uncomfortableness. Rubbing abdomen. Husband present.   HEENT: Normocephalic, atraumatic. Pupils equal, reactive to light. Conjunctivae clear. Sclerae anicteric.   NECK: Supple. Trachea midline. No lymphadenopathy, thyromegaly.   PULMONARY: Symmetric rise and fall of chest. Clear to auscultation throughout. No adventitious sounds.   CARDIOVASCULAR: Regular rhythm, S1, S2. No murmurs, no gallops.   ABDOMEN:  Distended. Marked discomfort noted throughout entire abdomen. High-pitched bowel sounds in all four quadrants. No bruits. No masses.   RECTAL: Deferred.   MUSCULOSKELETAL: Moving all four extremities. No contractures. No clubbing. Color: Pale, Warm, dry. No cyanosis. No jaundice.   EXTREMITIES: No edema.   NEUROLOGIC: Alert and oriented x4. Memory grossly intact. Flat affect but is probably related to feeling poor. No gross neurological deficits.   LABORATORY, DIAGNOSTIC AND RADIOLOGICAL DATA: Chemistry panel on admission: Glucose 107, sodium 131, chloride 96, calcium 7.7 with a lipase of 40. Today calcium has declined to 6.9, sodium 134, creatinine 0.56. Hepatic panel with date of 04/24: Total protein 5.5, albumin 1.8, alkaline phosphatase 48, otherwise within normal limits. WBC count on admission 6.8, RBC 3.72, hemoglobin 11.6, hematocrit 34.4, platelet count 512,000. Today hemoglobin declined to 9.9 with hematocrit of 29.3. Platelet count improved but still elevated at 449,000. Blood cultures x2: No growth 8 to 12 hours. Urinalysis revealed +1 ketones, +1 blood, protein 50 mg/dL, RBC 22 per high-power field, WBC 3 per high-power field, mucous present. 3-way of abdomen plus chest which was done yesterday revealed bowel distention, colon and small bile. Wall thickening of the colon could not be excluded. Findings such as colitis or ischemic bowel could present in this fashion. No free air. Chest clear.   IMPRESSION: Pancolitis findings on CT scan of abdomen and pelvis without contrast done. Patient has a significant at least two week history of abdominal pain, noted change in bowel habits, experiencing diarrhea, inclusive of nocturnal bowel movements, nausea.   PLAN:  1.  Patient's presentation was discussed with Dr. Verdie Shire. Recommendation is to await stool studies which have been ordered inclusive of C. difficile toxin A and B, comprehensive stool, stool for WBC as well as ova and parasite with  Giardia. Will continue with antibiotic therapy of ciprofloxacin as ordered as well as metronidazole IV. Continue with IV hydration. Continue clear liquid diet at this time as long as patient is able to tolerate.  2. Discussed with patient as well as her husband that at some point in the near future she will need to consider proceeding with a diagnostic colonoscopy based on current presentation of symptoms and findings on CT scan but as well known history of adenomatous colonic polyps which patient has not had surveillance since 2005.    These services were provided by myself under collaborative agreement with Dr. Verdie Shire.  Thank you for allowing [place] to participate in the care of Edd Fabian.   ____________________________ Payton Emerald, NP dsh:cms D: 09/20/2011 13:03:01 ET T: 09/20/2011 14:09:14 ET JOB#: [id number]  cc: Payton Emerald, NP, <Dictator> Payton Emerald MD ELECTRONICALLY SIGNED 09/24/2011 17:44

## 2014-09-19 NOTE — Consult Note (Signed)
Pt had only one bowel movement during nite, still watery.  Abd is flat and non tender.  She had several loose stools yesterday.  Ate some breakfast this morning.  Will see if diarrhea improves today.  I think she will see improvement soon,  WBC 4.8, hgb 10.8.  Thinking of removal from isolation since her stool did test neg for C. diff, but she could be passing spores given her diarrhea.  Defer to Dr. Clayborn Bigness about removal from isolation.  Electronic Signatures: Manya Silvas (MD)  (Signed on 08-May-13 09:14)  Authored  Last Updated: 08-May-13 09:14 by Manya Silvas (MD)

## 2014-09-19 NOTE — Consult Note (Signed)
Chief Complaint:   Subjective/Chief Complaint Admitted for diarrhea and abdominal pain. s/p Augmentin.  Diagnosed with C. Diff colitis. Ileus.  According to patient this morning she is feeling better though her husband states she had a bad day yesterday of bowel movements seven to eight times.  She slept throughout the night.  Patient was not aware but episode of fecal incontinence in the middle of the night.  Color of feces yellow-green.  Abdominal pain has improved.  No flatulence.  Denies nausea or vomiting.  Complaint of labia swelling, left side. Possible related to fecal incontience.   VITAL SIGNS/ANCILLARY NOTES: **Vital Signs.:   30-Apr-13 06:30   Vital Signs Type Routine   Temperature Temperature (F) 97.5   Celsius 36.3   Temperature Source oral   Pulse Pulse 63   Pulse source per Dinamap   Respirations Respirations 18   Systolic BP Systolic BP 841   Diastolic BP (mmHg) Diastolic BP (mmHg) 80   Mean BP 102   BP Source Dinamap   Pulse Ox % Pulse Ox % 96   Pulse Ox Activity Level  At rest   Oxygen Delivery Room Air/ 21 %   Brief Assessment:   Cardiac Regular  no murmur    Respiratory normal resp effort  clear BS    Gastrointestinal details normal Distended abdomen.  Evidence of mild anasarca. High pitch bowel sounds.  Mild tenderness throughout abdomen.    Additional Physical Exam Alert and oriented x 4.  Appears to feel better then when I saw her on admission.  Strength appears better.  Up to bathroom during interviewing process.  Sitting in recycliner.  Color tan.  Skin warm and dry.     Routine Chem:  30-Apr-13 02:08    Glucose, Serum 114   BUN 4   Creatinine (comp) 0.44   Sodium, Serum 138   Potassium, Serum 3.8   Chloride, Serum 106   CO2, Serum 22   Calcium (Total), Serum 7.7   Anion Gap 10   Osmolality (calc) 273   eGFR (African American) >60   eGFR (Non-African American) >60   Radiology Results: XRay:    29-Apr-13 13:49, Abdomen Complete 3 or more  views of abd   Abdomen Complete 3 or more views of abd    REASON FOR EXAM:    distention  COMMENTS:       PROCEDURE: DXR - DXR ABDOMEN COMPLETE  - Sep 24 2011  1:49PM     RESULT: Comparison is made to the prior exam of 09/22/2011. There are   again noted multiple mild dilated loops of colon containing air-fluid   levels. No bowel obstruction is identified. No abnormal intraabdominal   calcifications are seen. There are a few phleboliths in the right pelvis.   There are degenerative changes of the mid lumbar spine.    IMPRESSION:   1. No subdiaphragmatic free air is seen.  2. There are again noted multiple dilated loops of colon containing   nonspecific air-fluid levels. The degree of bowel dilatation shows little     interval change as compared to the prior exam.  3. No findings considered suspiciousfor bowel obstruction are seen.      Thank you for the opportunity to contribute to the care of your patient.           Verified By: Dionne Ano WALL, M.D., MD   Assessment/Plan:  Assessment/Plan:   Assessment Diagnosed with C. Diff colitis with gradually, slow improvement.  Ileus.  Plan Discussed patient's presentation with Dr. Verdie Shire.  Will proceed with flat and upright of abdomen today for continued monitoring of ileus.  Consultation placed for Dr. Lanae Boast for infectious disease evaluation.  Will awaiting ID consultation and recommendation with continue with Vancomycin 500 mg every six hours as prescribed. Lactobacillus probiotic capsule one capsule po tid.  Pending OB/GYN evaluation and recommendation in reference of labial swelling.  Encouraged increase movement by patient today.  Will continue to monitor.   Electronic Signatures: Payton Emerald (NP)  (Signed 30-Apr-13 08:22)  Authored: Chief Complaint, VITAL SIGNS/ANCILLARY NOTES, Brief Assessment, Lab Results, Radiology Results, Assessment/Plan   Last Updated: 30-Apr-13 08:22 by Payton Emerald (NP)

## 2014-09-19 NOTE — Consult Note (Signed)
Pt stool has become mushy formed over night.  No more watery stool.  She can go home on Entocort 3mg  tablets three of these every morning  and probiotic and yogurt 1-2 times a day.  Maybe K-dur for a week, small medium dose. I would like to see her Tuesday 1pm 5/21.  Diet is regular.  Should call us if she has watery stools and then get a repeat Cdiff PCR.    Electronic Signatures: Manya Silvas (MD)  (Signed on 14-May-13 13:33)  Authored  Last Updated: 14-May-13 13:33 by Manya Silvas (MD)

## 2014-09-19 NOTE — Consult Note (Signed)
Brief Consult Note: Diagnosis: Pancolitis.  At least two week history of abdominal pain with associated nausea and noted change in bowel habits.  Probable infectious in nature but possible ischemic as well as inflammatory.  Increase for infectious process given recent antibiotic therapy.   Consult note dictated.   Recommend further assessment or treatment.   Comments: Patient's presentation discussed with Dr. Verdie Shire.  Will await stool study results. Pending C Diff toxin A and B PCR, Comprehensive stool, O&P with giardia and Stool for WBC.  Continue with antibiotic therapy.  Continue IV hydration.  Diagnostic colonoscopy in the future recommended based on current symptoms as well as history of adenomatous colonic polyps with no colonic evaluation since 2005.  Electronic Signatures: Payton Emerald (NP)  (Signed 25-Apr-13 13:07)  Authored: Brief Consult Note   Last Updated: 25-Apr-13 13:07 by Payton Emerald (NP)

## 2014-09-19 NOTE — Consult Note (Signed)
PATIENT NAME:  Kaitlyn Roman, Kaitlyn Roman MR#:  [id number] DATE OF BIRTH:  07/16/1945  DATE OF CONSULTATION:  09/25/2011  REFERRING PHYSICIAN:  Dr. Posey Pronto CONSULTING PHYSICIAN:  Heinz Knuckles. Lyrica Mcclarty, MD  REASON FOR CONSULTATION: C. difficile colitis.   HISTORY OF PRESENT ILLNESS: The patient is a 69 year old white female with a past history significant for polymyalgia rheumatica on no immunosuppressive therapy who was admitted with diarrhea. The patient had initially had upper respiratory infection symptoms several weeks ago. She was treated with Augmentin for seven days. Her husband had similar symptoms which was diagnosed as a viral infection and he resolved without antibiotic therapy. Shortly after stopping the Augmentin she began having loose stools. The diarrhea persisted and worsened. She was in [place] at the time and went to the Emergency Room. She received IV fluids.  C. difficile testing at that time was obtained and she was discharged on Flagyl. She returned the next day and was told the C. difficile testing was negative and the Flagyl was stopped. She was told she likely had a viral illness. She was given more fluids and then started taking Imodium. She took approximately 4 to 5 doses of Imodium. During this time she returned home to [place]. She was seen by her primary care doctor who sent her to the Emergency Room. She had been feeling weak and dizzy and was found to be hypotensive in the Emergency Room. She denies any fevers, chills, or sweats. She was admitted to the hospital. She was initially started on Cipro and metronidazole IV. A C. difficile PCR returned positive and she was switched to oral vancomycin. She has not had any hematochezia or melena. She has not had any nausea or vomiting. She has had some abdominal distention but no significant abdominal pain. She does feel somewhat bloated.  No shortness of breath. No fevers, chills, or sweats.   ALLERGIES: None.   PAST MEDICAL HISTORY:   1. Polymyalgia rheumatica diagnosed several years ago. She was briefly on prednisone but is not currently on any immunosuppression.  2. Left ear acoustic neuroma status post resection in 1993.  3. Hypertension.  4. Hypercholesterolemia.  5. Status post hysterectomy.   SOCIAL HISTORY: The patient lives with her husband. She does not smoke. She does not drink. No injecting drug use history.   FAMILY HISTORY: Positive for inflammatory bowel disease and coronary artery disease.   REVIEW OF SYSTEMS:  GENERAL: No fevers, chills, or sweats. Some malaise. HEENT: No headache. No sinus congestion. No sore throat. NECK: No stiffness. No swollen glands.  RESPIRATORY: No cough. No shortness of breath. No sputum production. CARDIAC: No chest pains or palpitations. GI: No nausea, no vomiting, no abdominal pain. Positive bloating. Positive abdominal distention. Positive loose stools multiple times per day. GU: No complaints.  MUSCULOSKELETAL: She has some malaise and achiness but no frank arthritis. NEUROLOGIC: No focal weakness. PSYCHIATRIC: No complaints. All other systems are negative.   PHYSICAL EXAMINATION:  VITAL SIGNS: T-max of 102.9, T-current 98.1, pulse 73, blood pressure 114/74, 97% on 2 liters.   GENERAL: 69 year old white female in no acute distress.   HEENT: Normocephalic, atraumatic. Pupils equal and reactive to light. Extraocular motion intact. Sclerae, conjunctivae, and lids are without evidence for emboli or petechiae. Oropharynx shows no erythema or exudate. Teeth and gums are in fair condition.   NECK: Supple. Full range of motion. Midline trachea. No lymphadenopathy. No thyromegaly.   LUNGS: Clear to auscultation bilaterally with good air movement. No focal consolidation.  HEART: Regular rate and rhythm without murmur, rub, or gallop.   ABDOMEN: Minimally distended, minimally tender throughout but no rebound or guarding. No hepatosplenomegaly. No hernia is noted.   EXTREMITIES:  No evidence for tenosynovitis.   SKIN: No rashes. No stigmata of endocarditis, specifically no Janeway lesions or Osler nodes.   NEUROLOGIC: The patient is awake and interactive, moving all four extremities.   PSYCHIATRIC: Mood and affect appeared normal.   LABORATORY, DIAGNOSTIC, AND RADIOLOGICAL DATA: BUN 4, creatinine 0.44, bicarbonate 22, anion gap 10. LFTs were unremarkable. White count from yesterday was 5.5 with a hemoglobin 10.8, platelet count 497, ANC 4.3. White count on admission was 6.8. Blood cultures from admission show no growth. Stool culture shows no growth. Stool white cells were negative. A C. difficile PCR was positive on 04/25 and 04/29. Urinalysis was unremarkable. Giardia and O and P were both negative. A 3-way of the abdomen demonstrated bowel distention of the colon and small bowel. Wall thickening of the colon could not be excluded. There was no free air noted. A CT scan of the abdomen and pelvis showed diffuse colonic bowel wall thickening and pericolic inflammatory changes with abdominal and pelvic free fluid concerning for pancolitis. Follow-up abdominal films have shown continued colonic distention, but without significant progression.   IMPRESSION:  69 year old white female with a history of polymyalgia rheumatica on no immunosuppressive therapy who was admitted for C. difficile colitis.   RECOMMENDATIONS:  1. She received Augmentin for what sounds like a viral upper respiratory infection. She was initially given metronidazole which was stopped after a C. difficile test was negative. I suspect that this was a toxin assay, which is not as sensitive as PCR. She then took several doses of Imodium. When she was admitted she was first treated with Cipro and metronidazole before being changed to vancomycin orally. The anti-gram-negative rod effects of the Augmentin and Cipro likely had effect on her normal bowel flora.  The Imodium could have concentrated the toxin by making her  not excrete it as efficiently, making her symptoms more prolonged.  2. I agree with oral vancomycin.  3. We discussed that improvement may be a slow process and incremental rather than abrupt. We will follow her stool pattern over the next several days.  4. We addressed the issues of relapse and possible use of Dificid or stool transfer if this occurs.  5. Would follow her stool output.  6. Her colonic dilatation has been stable.  7. Would avoid unnecessary antibiotics in the future to minimize the effects on normal bowel flora.  8. Would continue contact isolation.     This is a moderately complex infectious disease consult. Thank you very much for involving me in Mrs. Weathersby's care.  ____________________________ Heinz Knuckles. Carlissa Pesola, MD meb:bjt D: 09/25/2011 14:58:24 ET T: 09/25/2011 16:02:00 ET JOB#: [id number]  cc: Heinz Knuckles. Broughton Eppinger, MD, <Dictator> Huxley Shurley E Zuri Bradway MD ELECTRONICALLY SIGNED 09/26/2011 14:34

## 2014-09-19 NOTE — Consult Note (Signed)
Pt K was down so I ordered K in iv and oral.  Stools beginning to have some sediment, that is a good sign.  Pt eating better, abd feels better, still with 7-8 stools in a 24 hour period.  She says she is not much of a fluid drinker and I am concerned she still needs iv fluids until she is down to 3 stools a day and consistency is forming.  Continue Dificid for now.  Electronic Signatures: Manya Silvas (MD)  (Signed on 09-May-13 18:02)  Authored  Last Updated: 09-May-13 18:02 by Manya Silvas (MD)

## 2014-09-19 NOTE — Consult Note (Signed)
Pt seen and examined. Signif cramping. Some diarrhea. No bleeding. Pancolitis on CT. On IV Abx. Hx of colon polyps in the past. Stool studies ordered. Will eventually need colonscopy once cramping/pain subsides and stool tests are back. Monday? Will follow. Thanks  Electronic Signatures: Verdie Shire (MD)  (Signed on 25-Apr-13 13:25)  Authored  Last Updated: 25-Apr-13 13:25 by Verdie Shire (MD)

## 2014-09-19 NOTE — Consult Note (Signed)
Pt with several loose stools today.  Her SPIE showed adequate IgG level above 700, low albumin of 2.2.  Discussed diet and fluids and expected prolonged recovery with her and husband.  Will change diet to low residue and low fat.  Electronic Signatures: Manya Silvas (MD)  (Signed on 07-May-13 18:41)  Authored  Last Updated: 07-May-13 18:41 by Manya Silvas (MD)

## 2014-09-19 NOTE — Consult Note (Signed)
Chief Complaint:   Subjective/Chief Complaint C/O abdominal bloating and diarrhea with 6 bowel movements today so far. No vomiting.   VITAL SIGNS/ANCILLARY NOTES: **Vital Signs.:   27-Apr-13 14:48   Vital Signs Type Routine   Temperature Temperature (F) 98.3   Celsius 36.8   Temperature Source oral   Pulse Pulse 74   Pulse source per Dinamap   Respirations Respirations 18   Systolic BP Systolic BP 517   Diastolic BP (mmHg) Diastolic BP (mmHg) 72   Mean BP 86   BP Source Dinamap   Pulse Ox % Pulse Ox % 98   Pulse Ox Activity Level  At rest   Oxygen Delivery Room Air/ 21 %   Brief Assessment:   Additional Physical Exam Abdomen is soft with diffuse guarding. Bowel sounds are high pitched. No rebound.   Routine Chem:  27-Apr-13 04:48    Glucose, Serum 100   BUN 5   Creatinine (comp) 0.57   Sodium, Serum 136   Potassium, Serum 3.5   Chloride, Serum 103   CO2, Serum 24   Calcium (Total), Serum 7.3   Osmolality (calc) 269   eGFR (African American) >60   eGFR (Non-African American) >60   Anion Gap 9   Magnesium, Serum 1.6   Radiology Results: XRay:    27-Apr-13 09:59, Abdomen 3 Way Includes PA Chest   Abdomen 3 Way Includes PA Chest    REASON FOR EXAM:    abd distension, pancolitis  COMMENTS:       PROCEDURE: DXR - DXR ABDOMEN 3-WAY (INCL PA CXR)  - Sep 22 2011  9:59AM     RESULT: Comparisons:   09/19/2011    Findings:      PA chest and supine, upright, and left lateral decubitus views of the   abdomen are provided.     There are trace bilateral pleural effusions. The heart and mediastinum   are unremarkable. The osseous structures are unremarkable.      There is colonic distention with multiple colonic air-fluid levels. There   is air seen within the bowel. There is colonic bowel wall thickening with   thumbprinting consistent with colitis. Soft tissue shadows are normal.   There is no evidence of pneumoperitoneum, portal venous gas, or    pneumatosis.    Osseous structures are unremarkable.    IMPRESSION:     Please see above.    Dictation Site: 3        Verified By: Jennette Banker, M.D., MD   Assessment/Plan:  Assessment/Plan:   Assessment Moderate to severe C. diff colitis. No fever or leucocytosis although significant colonic wall edema and distension have been noted on imaging. No signs of perforation, pneumoperitoneum or toxic megacolon.    Plan Agree with PO Vancomycin in place of IV Flagyl as IV Flagyl is often ineffective. Will follow. Patient's husband had numerous concerns and questions which were answered/ resolved.   Electronic Signatures: Jill Side (MD)  (Signed 27-Apr-13 19:58)  Authored: Chief Complaint, VITAL SIGNS/ANCILLARY NOTES, Brief Assessment, Lab Results, Radiology Results, Assessment/Plan   Last Updated: 27-Apr-13 19:58 by Jill Side (MD)

## 2014-09-19 NOTE — Consult Note (Signed)
Chief Complaint:   Subjective/Chief Complaint Feels better with less abdominal distension. Still with diarrhea although better.   VITAL SIGNS/ANCILLARY NOTES: **Vital Signs.:   28-Apr-13 09:52   Vital Signs Type Routine   Temperature Temperature (F) 99.5   Celsius 37.5   Temperature Source oral   Pulse Pulse 83   Pulse source per Dinamap   Respirations Respirations 20   Systolic BP Systolic BP 151   Diastolic BP (mmHg) Diastolic BP (mmHg) 79   Mean BP 93   BP Source Dinamap   Pulse Ox % Pulse Ox % 98   Pulse Ox Activity Level  At rest   Oxygen Delivery Room Air/ 21 %   Brief Assessment:   Additional Physical Exam Abdomen is softer than yesterday without significant tenderness or guarding. Bowel sounds are more normal today.   Routine Hem:  28-Apr-13 07:09    WBC (CBC) 5.1   RBC (CBC) 3.45   Hemoglobin (CBC) 10.8   Hematocrit (CBC) 32.0   Platelet Count (CBC) 507   MCV 93   MCH 31.3   MCHC 33.8   RDW 13.3  Routine Chem:  28-Apr-13 07:09    Glucose, Serum 97   BUN 3   Creatinine (comp) 0.53   Sodium, Serum 138   Potassium, Serum 3.8   Chloride, Serum 106   CO2, Serum 23   Calcium (Total), Serum 7.2   Osmolality (calc) 272   eGFR (African American) >60   eGFR (Non-African American) >60   Anion Gap 9  Routine Hem:  28-Apr-13 07:09    Neutrophil % 78.8   Lymphocyte % 12.0   Monocyte % 8.0   Eosinophil % 0.8   Basophil % 0.4   Neutrophil # 4.1   Lymphocyte # 0.6   Monocyte # 0.4   Eosinophil # 0.0   Basophil # 0.0  Routine Chem:  28-Apr-13 07:09    Magnesium, Serum 1.7   Assessment/Plan:  Assessment/Plan:   Assessment C. diff colitis. Clinically better.    Plan Continue Vancomycin for a total of 10-14 days. May reduce dose to 250 mg q 6 hours at discharge. Dr. Candace Cruise will resume care in am.   Electronic Signatures: Jill Side (MD)  (Signed 28-Apr-13 15:13)  Authored: Chief Complaint, VITAL SIGNS/ANCILLARY NOTES, Brief Assessment, Lab Results,  Assessment/Plan   Last Updated: 28-Apr-13 15:13 by Jill Side (MD)

## 2014-09-19 NOTE — Discharge Summary (Signed)
PATIENT NAME:  Kaitlyn Roman, Kaitlyn Roman MR#:  [id number] DATE OF BIRTH:  20-Dec-1945  DATE OF ADMISSION:  09/19/2011 DATE OF DISCHARGE:  10/09/2011  CONSULTANTS:  1. Dr. Clayborn Bigness. 2. Dr. Vira Agar.  3. Dr. Dionne Milo.  4. Dr. Candace Cruise.  CHIEF COMPLAINT: Diarrhea.   DISCHARGE DIAGNOSES:  1. Clostridium difficile colitis. 2. Relative hypotension. 3. Anemia, likely dilutional.  4. Hyponatremia.  5. Hypokalemia.   SECONDARY DIAGNOSES:  1. History of PMR diagnosed 4 to 5 years ago, off of prednisone.  2. History of left ear acoustic neuroma status post surgery and deafness in that ear. 3. Hypertension. 4. Hyperlipidemia.   DISCHARGE MEDICATIONS:  1. Losartan 100 mg daily.  2. Lipitor 20 mg daily.  3. Entocort 9 mg orally daily for 30 days. 4. Lactobacillus probiotic capsule one cap orally t.i.d. for a month. 5. Potassium chloride in the form on K-Dur 20 mEq daily.   DISPOSITION: Home.   DIET: Low sodium, ADA diet.   ACTIVITY: As tolerated.   FOLLOW UP: Please follow up with Dr. Vira Agar next Tuesday as scheduled.   HOSPITAL COURSE: For previous hospital course please see the interim discharge summary dictated on 09/28/2011 by Dr. Posey Pronto and 10/05/2011 by Dr. Vianne Bulls. 05/10 patient has had off and on diarrhea, currently resolved. Patient had repeat flex sigmoidoscopy on 10/06/2011. That examination showed erythematous mucosal in the rectum and sigmoid colon. This was biopsied. Patient also had internal hemorrhoids at that time. The biopsy results from flex sigmoidoscopy has shown some mild edema and very minimal focal active colitis. Negative for dysplasia and malignancy. This is compatible with recent and resolving infectious colitis. She was started on Entocort and is to be continued for a month. Clostridium difficile stool culture from the flex sigmoidoscopy has been negative from the 11th. Furthermore, stool cultures obtained on 05/12 has been negative. The case was discussed with Dr. Vira Agar. At  this point, as the diarrhea has resolved she will be discharged with outpatient follow up. She is to continue Entocort for a month as well as probiotics and was counseled to refrain from further antibiotic use until seen by her doctors and deemed medical necessity.   In regards to the hypokalemia, patient has had p.o. and IV potassium as needed. The potassium from 05/13 was 3.8. She will be discharged with K-Dur for a week.   Patient did also have anemia. Last hemoglobin was 11.4 and that has an up trend from 10.8 from 05/08. Patient has had no significant bleed and would be discharged with outpatient follow up.   Hypertension. Patient did have relative hypotension and initially the losartan was held. Currently she is on one-half the dose of losartan on discharge. She can resume a full dose.   In regards to the right hand pain, this was in the lateral aspect of the right hand near the wrist area. Patient had some swelling. Uric acid was checked which was normal and x-rays did not show any fractures. ESR and CRP were checked which were high, however, I think that is in the setting of the Clostridium difficile colitis. At this point as she has had improved symptoms and no further diarrhea she will be discharged.   TOTAL TIME SPENT: 35 minutes.       CODE STATUS: Patient is FULL CODE.  ____________________________ Vivien Presto, MD sa:cms D: 10/09/2011 14:15:14 ET T: 10/10/2011 10:16:12 ET JOB#: [id number]  cc: Vivien Presto, MD, <Dictator> Deborra Medina, MD Manya Silvas, MD Vivien Presto MD ELECTRONICALLY SIGNED 10/12/2011 16:31

## 2014-09-19 NOTE — Consult Note (Signed)
Pt flex sig showed grannular mild irritation in rectum and normal in sigmoid.  Will change to Dificid for a few days.  Her stool is now neg for C. diff.  Diarrhea may be due to lag in treating infection and mucosal healing.  Will change to a few days of Dificid in case the liquid vancomycin is causing diarrhea.  Pictures shown to husband and patient.  Electronic Signatures: Manya Silvas (MD)  (Signed on 06-May-13 16:40)  Authored  Last Updated: 06-May-13 16:40 by Manya Silvas (MD)

## 2014-09-19 NOTE — Consult Note (Signed)
I have discussed stool transplant with patient and family as a treatment method for C diff colitis not responding to usual treatment.  It may be an option if no improvement by Monday.  The husband and son are not acceptable donors as they have been on antibiotics.  Will get donor from my office staff if needed.  Due to non response to medication will check serum protein electrophoresis and make sure she is not hypogammaglobulinemia.  Electronic Signatures: Manya Silvas (MD)  (Signed on 04-May-13 12:08)  Authored  Last Updated: 04-May-13 12:08 by Manya Silvas (MD)

## 2014-09-19 NOTE — Consult Note (Signed)
Pt still with some abd discomfort, occurs with coughing.  Been on oral vancomycin for a few days, one noct bowel movement, some during day.  All loose.  If no improvement by Monday will do stool transplant.  Electronic Signatures: Manya Silvas (MD)  (Signed on 04-May-13 11:52)  Authored  Last Updated: 04-May-13 11:52 by Manya Silvas (MD)

## 2014-09-19 NOTE — Consult Note (Signed)
Chief Complaint:   Subjective/Chief Complaint Feels better. Abd less distended. 3 BM's so far. X-ray shows some improvement. Again, many questions from husband.   VITAL SIGNS/ANCILLARY NOTES: **Vital Signs.:   02-May-13 14:00   Vital Signs Type Routine   Temperature Temperature (F) 98   Celsius 36.6   Temperature Source oral   Pulse Pulse 72   Pulse source per Dinamap   Respirations Respirations 18   Systolic BP Systolic BP 568   Diastolic BP (mmHg) Diastolic BP (mmHg) 81   Mean BP 95   BP Source Dinamap   Pulse Ox % Pulse Ox % 97   Pulse Ox Activity Level  At rest   Oxygen Delivery Room Air/ 21 %   Brief Assessment:   Cardiac Regular    Respiratory clear BS    Gastrointestinal less distended. soft   Routine Hem:  02-May-13 03:12    WBC (CBC) 5.3   RBC (CBC) 3.40   Hemoglobin (CBC) 10.7   Hematocrit (CBC) 31.4   Platelet Count (CBC) 515   MCV 92   MCH 31.6   MCHC 34.2   RDW 13.5  Routine Chem:  02-May-13 03:12    Glucose, Serum 84   BUN 2   Creatinine (comp) 0.47   Sodium, Serum 141   Potassium, Serum 3.3   Chloride, Serum 107   CO2, Serum 27   Calcium (Total), Serum 7.3   Osmolality (calc) 277   eGFR (African American) >60   eGFR (Non-African American) >60   Anion Gap 7  Routine Hem:  02-May-13 03:12    Neutrophil % 69.7   Lymphocyte % 20.7   Monocyte % 8.3   Eosinophil % 0.3   Basophil % 1.0   Neutrophil # 3.7   Lymphocyte # 1.1   Monocyte # 0.4   Eosinophil # 0.0   Basophil # 0.1   Assessment/Plan:  Assessment/Plan:   Assessment C.diff colitis. Some improvement.    Plan Continue vanco. Do not switch to dificid at this time. Will try low residue diet and see.   Electronic Signatures: Verdie Shire (MD)  (Signed [phone] 15:40)  Authored: Chief Complaint, VITAL SIGNS/ANCILLARY NOTES, Brief Assessment, Lab Results, Assessment/Plan   Last Updated: 02-May-13 15:40 by Verdie Shire (MD)

## 2014-09-19 NOTE — Consult Note (Signed)
Chief Complaint:   Subjective/Chief Complaint 5 loose stools so far. Abd less distended than yest.   VITAL SIGNS/ANCILLARY NOTES: **Vital Signs.:   03-May-13 14:05   Vital Signs Type Routine   Temperature Temperature (F) 98.3   Celsius 36.8   Temperature Source oral   Pulse Pulse 78   Pulse source per Dinamap   Respirations Respirations 20   Systolic BP Systolic BP 520   Diastolic BP (mmHg) Diastolic BP (mmHg) 78   Mean BP 93   BP Source Dinamap   Pulse Ox % Pulse Ox % 97   Pulse Ox Activity Level  At rest   Oxygen Delivery Room Air/ 21 %   Brief Assessment:   Cardiac Regular    Respiratory clear BS    Gastrointestinal less distended. soft. min tenderness   Routine Chem:  03-May-13 03:52    Glucose, Serum 81   BUN 1   Creatinine (comp) 0.49   Sodium, Serum 141   Potassium, Serum 3.6   Chloride, Serum 107   CO2, Serum 26   Calcium (Total), Serum 7.7   Osmolality (calc) 276   eGFR (African American) >60   eGFR (Non-African American) >60   Anion Gap 8   Assessment/Plan:  Assessment/Plan:   Assessment C.diff colitis. Slow improvement.    Plan Continue vanco. Repeat x-ray in AM. Dr. Vira Agar to see patient tomorrow, and I will see patient on Sunday. Thanks.   Electronic Signatures: Verdie Shire (MD)  (Signed [phone] 15:32)  Authored: Chief Complaint, VITAL SIGNS/ANCILLARY NOTES, Brief Assessment, Lab Results, Assessment/Plan   Last Updated: 03-May-13 15:32 by Verdie Shire (MD)

## 2014-09-19 NOTE — Consult Note (Signed)
Impression: 69yo WF w/ h/o PMR on no immunosuppressive therapy who was admitted for C. diff colitis.  She received augmentin for what sounds like a viral URI.  She was initially given metronidazole which was stopped after a C. diff test was negative.  I suspect that this was a toxin assay which is not as sensitive as PCR.  She then took several doses of immodium.  When she was admitted, she was first treated with Cipro and metronidazole, before being changed to vanco.  The antiGNR effects of the augmentin and cipro likely had effects on her normal bowel flora.  The immodium could have concentrated the toxin, making her symptoms more prolonged. Agree with vancomycin.   We discussed that improvement may be a slow process and incremental rather than abrupt.  Will follow her stool pattern. We addressed the issue of relapse and the possible use of dificid or stool transfer if this occurs. Follow stool output. Her colonic dilation is stable. Would avoid unnecessary antibiotics in the future to minimize effects on normal bowel flora. Continue contact isolation.  Electronic Signatures: Johnthan Axtman, Heinz Knuckles (MD)  (Signed on 30-Apr-13 14:49)  Authored  Last Updated: 30-Apr-13 14:49 by Mikeisha Lemonds, Heinz Knuckles (MD)

## 2014-09-19 NOTE — Consult Note (Signed)
Pt with mucosal minimal inflammation in distal sigmoid and rectum.  Bx done.  Will move her to another room in case this one is full of spores of C. diff.  Will start Entocort for now to see if can heal up inflammation.  If no improvement will do stool transplant Wednesday.  Electronic Signatures: Manya Silvas (MD)  (Signed on 11-May-13 10:54)  Authored  Last Updated: 11-May-13 10:54 by Manya Silvas (MD)

## 2014-09-19 NOTE — Consult Note (Signed)
Chief Complaint:   Subjective/Chief Complaint Long discussion with family. Many questions regarding fecal transplant, which is typically given for recurrent c.diff. At this time we don't have a suitable stool donor. Pt had 6-7 loose stools yest. Tired.   VITAL SIGNS/ANCILLARY NOTES: **Vital Signs.:   05-May-13 05:33   Vital Signs Type Routine   Temperature Temperature (F) 98.8   Celsius 37.1   Pulse Pulse 71   Respirations Respirations 19   Systolic BP Systolic BP 220   Diastolic BP (mmHg) Diastolic BP (mmHg) 75   Mean BP 88   Pulse Ox % Pulse Ox % 97   Pulse Ox Activity Level  At rest   Oxygen Delivery Room Air/ 21 %   Brief Assessment:   Cardiac Regular    Respiratory clear BS    Gastrointestinal Normal   Routine Hem:  05-May-13 06:22    WBC (CBC) 3.7   RBC (CBC) 3.59   Hemoglobin (CBC) 11.3   Hematocrit (CBC) 33.1   Platelet Count (CBC) 410   MCV 92   MCH 31.4   MCHC 34.1   RDW 13.7  Routine Chem:  05-May-13 06:22    Glucose, Serum 85   BUN 2   Creatinine (comp) 0.46   Sodium, Serum 140   Potassium, Serum 3.0   Chloride, Serum 105   CO2, Serum 28   Calcium (Total), Serum 7.6   Osmolality (calc) 275   eGFR (African American) >60   eGFR (Non-African American) >60   Anion Gap 7  Routine Hem:  05-May-13 06:22    Neutrophil % 49.6   Lymphocyte % 37.1   Monocyte % 11.0   Eosinophil % 0.9   Basophil % 1.4   Neutrophil # 1.8   Lymphocyte # 1.4   Monocyte # 0.4   Eosinophil # 0.0   Basophil # 0.0   Radiology Results: XRay:    05-May-13 08:33, Abdomen 3 Way Includes PA Chest   Abdomen 3 Way Includes PA Chest    REASON FOR EXAM:    ileus and dilatation with c.diff  COMMENTS:       PROCEDURE: DXR - DXR ABDOMEN 3-WAY (INCL PA CXR)  - Sep 30 2011  8:33AM     RESULT: Comparison is made to the study of 29 Sep 2011 and 27 Sep 2011.    The chest film reveals the lungs to be well-expanded. There is a trace of   blunting of the costophrenic gutters which is  not new. The cardiac   silhouette is normal in size. The pulmonary vascularity is not engorged.    There remains moderate gaseous distention of bowel in the midand upper   abdomen. The degree of distention is not greatly changed since the   previous study. I see no free extraluminal gas collections. There is no   significant gas demonstrated in the upper rectum.  IMPRESSION:  There is persistent gaseous distention of small and large   bowel loops. The majority of distended bowel within the left upper   quadrant and left mid abdomen appears to reflect small bowel. Nasogastric   suction may be useful.    Dictation Site: 5          Verified By: DAVID A. Martinique, M.D., MD   Assessment/Plan:  Assessment/Plan:   Assessment c.diff colitis. Slow progression.    Plan Recheck c.diff. Will discuss case with Dr. Vira Agar tomorrow. Husband wants family discussion with Dr. Vira Agar tomorrow. Thanks.   Electronic Signatures: Verdie Shire (  MD)  (Signed 05-May-13 10:25)  Authored: Chief Complaint, VITAL SIGNS/ANCILLARY NOTES, Brief Assessment, Lab Results, Radiology Results, Assessment/Plan   Last Updated: 05-May-13 10:25 by Verdie Shire (MD)

## 2014-09-19 NOTE — Consult Note (Signed)
Pt still with 6-7 loose stools in a 24 hour period, getting discouraged.  We have tried a conservative approach and she is still having diarrhea, with a little sediment.  I plan to do stool transplant Tuesday and take biopsies of the colon at the same time to check for microscopic colitis which will respond to Entocort.  Will do stool cultures again.    Electronic Signatures: Manya Silvas (MD)  (Signed on 10-May-13 18:22)  Authored  Last Updated: 10-May-13 18:22 by Manya Silvas (MD)

## 2014-09-19 NOTE — Consult Note (Signed)
Pt eating better, still with loose stools 6-7 times a day. Less abd discomfort.  I spoke to pathologist and  Await biopsies.  Plan for stool transplant Wednesday. Suitable donor from our office interviewed and will have blood tests.  Electronic Signatures: Manya Silvas (MD)  (Signed on 13-May-13 14:58)  Authored  Last Updated: 13-May-13 14:58 by Manya Silvas (MD)

## 2014-09-19 NOTE — Consult Note (Signed)
Chief Complaint:   Subjective/Chief Complaint still with abd cramping and bloating. Positive for c.diff. Husband with many questions.   VITAL SIGNS/ANCILLARY NOTES: **Vital Signs.:   26-Apr-13 07:23   Vital Signs Type Routine   Temperature Temperature (F) 98.5   Celsius 36.9   Temperature Source oral   Pulse Pulse 70   Pulse source per Dinamap   Respirations Respirations 18   Systolic BP Systolic BP 099   Diastolic BP (mmHg) Diastolic BP (mmHg) 70   Mean BP 83   BP Source Dinamap   Pulse Ox % Pulse Ox % 94   Pulse Ox Activity Level  At rest   Oxygen Delivery Room Air/ 21 %   Brief Assessment:   Cardiac Regular    Respiratory clear BS    Gastrointestinal distended. diffuse tenderness   Routine Hem:  26-Apr-13 04:28    WBC (CBC) 5.2   RBC (CBC) 3.38   Hemoglobin (CBC) 10.6   Hematocrit (CBC) 31.5   Platelet Count (CBC) 483   MCV 93   MCH 31.3   MCHC 33.7   RDW 13.0  Routine Chem:  26-Apr-13 04:28    Glucose, Serum 96   BUN 7   Creatinine (comp) 0.66   Sodium, Serum 136   Potassium, Serum 3.5   Chloride, Serum 104   CO2, Serum 22   Calcium (Total), Serum 7.2   Osmolality (calc) 270   eGFR (African American) >60   eGFR (Non-African American) >60   Anion Gap 10  Routine Hem:  26-Apr-13 04:28    Neutrophil % 81.7   Lymphocyte % 8.5   Monocyte % 8.8   Eosinophil % 0.6   Basophil % 0.4   Neutrophil # 4.2   Lymphocyte # 0.4   Monocyte # 0.5   Eosinophil # 0.0   Basophil # 0.0  Routine Chem:  26-Apr-13 04:28    Magnesium, Serum 1.3   Assessment/Plan:  Assessment/Plan:   Assessment C.diff colitis.    Plan Continue IV flagyl.Cipro stopped. No need for colonoscopy right now. Later after she is fully recovered. Increase IVF. Abd series in AM. If signif ileus, may need to hold fluids also. Dr.Iftikhar to see patient over the weekend. THanks.   Electronic Signatures: Verdie Shire (MD)  (Signed 26-Apr-13 12:30)  Authored: Chief Complaint, VITAL  SIGNS/ANCILLARY NOTES, Brief Assessment, Lab Results, Assessment/Plan   Last Updated: 26-Apr-13 12:30 by Verdie Shire (MD)

## 2014-09-19 NOTE — Consult Note (Signed)
Chief Complaint:   Subjective/Chief Complaint Felt ok yest but sl worse today. More abd cramping.   VITAL SIGNS/ANCILLARY NOTES: **Vital Signs.:   29-Apr-13 05:14   Vital Signs Type Routine   Temperature Temperature (F) 99.3   Celsius 37.3   Pulse Pulse 81   Respirations Respirations 19   Systolic BP Systolic BP 892   Diastolic BP (mmHg) Diastolic BP (mmHg) 77   Mean BP 92   Pulse Ox % Pulse Ox % 95   Pulse Ox Activity Level  At rest   Oxygen Delivery Room Air/ 21 %   Brief Assessment:   Cardiac Regular    Respiratory clear BS    Gastrointestinal distended. Diffusely tender     Routine Hem:  29-Apr-13 05:10    WBC (CBC) 5.5   RBC (CBC) 3.43   Hemoglobin (CBC) 10.8   Hematocrit (CBC) 32.1   Platelet Count (CBC) 497   MCV 94   MCH 31.5   MCHC 33.7   RDW 13.0   Neutrophil % 77.3   Lymphocyte % 14.7   Monocyte % 6.7   Eosinophil % 0.7   Basophil % 0.6   Neutrophil # 4.3   Lymphocyte # 0.8   Monocyte # 0.4   Eosinophil # 0.0   Basophil # 0.0  Routine Chem:  29-Apr-13 05:10    Glucose, Serum 95   BUN 3   Creatinine (comp) 0.47   Sodium, Serum 138   Potassium, Serum 3.8   Chloride, Serum 106   CO2, Serum 24   Calcium (Total), Serum 7.3   Anion Gap 8   Osmolality (calc) 272   eGFR (African American) >60   eGFR (Non-African American) >60   Radiology Results: XRay:    27-Apr-13 09:59, Abdomen 3 Way Includes PA Chest   Abdomen 3 Way Includes PA Chest    REASON FOR EXAM:    abd distension, pancolitis  COMMENTS:       PROCEDURE: DXR - DXR ABDOMEN 3-WAY (INCL PA CXR)  - Sep 22 2011  9:59AM     RESULT: Comparisons:   09/19/2011    Findings:      PA chest and supine, upright, and left lateral decubitus views of the   abdomen are provided.     There are trace bilateral pleural effusions. The heart and mediastinum   are unremarkable. The osseous structures are unremarkable.      There is colonic distention with multiple colonic air-fluid levels. There    is air seen within the bowel. There is colonic bowel wall thickening with   thumbprinting consistent with colitis. Soft tissue shadows are normal.   There is no evidence of pneumoperitoneum, portal venous gas, or   pneumatosis.    Osseous structures are unremarkable.    IMPRESSION:     Please see above.    Dictation Site: 3        Verified By: Jennette Banker, M.D., MD   Assessment/Plan:  Assessment/Plan:   Assessment C.diff colitis with ileus.    Plan Continue vanco. Add probiotics daily. Repeat x-ray. Go back to liquid diet until sxs much improved. Avoid narcotics for pain control. Lot of time spent answering husband's questions.   Electronic Signatures: Verdie Shire (MD)  (Signed 29-Apr-13 15:06)  Authored: Chief Complaint, VITAL SIGNS/ANCILLARY NOTES, Brief Assessment, Lab Results, Radiology Results, Assessment/Plan   Last Updated: 29-Apr-13 15:06 by Verdie Shire (MD)

## 2014-09-19 NOTE — Consult Note (Signed)
Chief Complaint:   Subjective/Chief Complaint Pt feels about same. Again, husband with many questions. 6 BM's so far. No more bleeding.   VITAL SIGNS/ANCILLARY NOTES: **Vital Signs.:   01-May-13 15:01   Vital Signs Type Routine   Temperature Temperature (F) 98.5   Celsius 36.9   Temperature Source oral   Pulse Pulse 84   Pulse source per Dinamap   Respirations Respirations 18   Systolic BP Systolic BP 599   Diastolic BP (mmHg) Diastolic BP (mmHg) 84   Mean BP 102   BP Source Dinamap   Pulse Ox % Pulse Ox % 97   Pulse Ox Activity Level  At rest   Oxygen Delivery Room Air/ 21 %   Brief Assessment:   Cardiac Regular    Respiratory clear BS    Gastrointestinal distended but soft. No rebound or guarding.   Routine Chem:  01-May-13 03:34    Glucose, Serum 73   BUN 5   Creatinine (comp) 0.58   Sodium, Serum 141   Potassium, Serum 3.1   Chloride, Serum 106   CO2, Serum 28   Calcium (Total), Serum 7.3   Osmolality (calc) 277   eGFR (African American) >60   eGFR (Non-African American) >60   Anion Gap 7   Assessment/Plan:  Assessment/Plan:   Assessment C.diff colitis. On oral vanco.    Plan Continue vanco. Could switch to dificid if patient not satified with progress. Feel that compare to initial hospitalization, overall improved despite ileus/diarrhea.   Electronic Signatures: Verdie Shire (MD)  (Signed 01-May-13 17:42)  Authored: Chief Complaint, VITAL SIGNS/ANCILLARY NOTES, Brief Assessment, Lab Results, Assessment/Plan   Last Updated: 01-May-13 17:42 by Verdie Shire (MD)

## 2014-09-19 NOTE — H&P (Signed)
PATIENT NAME:  Kaitlyn Roman, Kaitlyn Roman MR#:  [id number] DATE OF BIRTH:  1945-09-26  DATE OF ADMISSION:  09/19/2011  PRIMARY CARE PHYSICIAN:  Dr. Derrel Nip. ED REFERRING PHYSICIAN: Dr. Roxine Caddy.   CHIEF COMPLAINT: Diarrhea.   HISTORY OF PRESENT ILLNESS: The patient is a 69 year old white female who reports that in the beginning part of January she started to develop cough and later developed upper respiratory infection and was treated with Augmentin for seven days. After the Augmentin she started to have liquid diarrhea a few times a day. Initially, was dark in color. The diarrhea persisted. She also started having epigastric pain. The patient's symptom got a little better so she was visiting [place] last week. There the diarrhea started getting worse. She started feeling dizzy. She was seen in the hospital. They gave her IV fluids, got stool cultures for Clostridium difficile and was discharged home on p.o. Flagyl. The next day she continued to feel bad, so she went back. They told her that the C. difficile was negative. The patient again given IV fluids. She continues to have multiple liquid bowel movements, yellow in color. She was seen by Dr. Derrel Nip and referred to the hospital. The patient has been feeling very weak and has been feeling dizzy. Initially, her blood pressure was noted to be low. The patient also complains of diffuse abdominal pain. She describes it as a sharp type of pain. She has not had any nausea or vomiting. She is also complaining of low grade fevers. She denies any unusual food consumption. Denies any blood in the stool. Denies any shortness of breath or chest pain.   PAST MEDICAL HISTORY:  1. History of PMR diagnosed 4 to 5 years ago. Was on Prednisone which was stopped.  2. History of left ear acoustic neuroma status post surgery in 1993.  3. Hypertension.  4. Hyperlipidemia.   PAST SURGICAL HISTORY:  1. Status post acoustic neuroma resection.  2. Status post hysterectomy.   3. Status post right cataract surgery.   ALLERGIES: None.   MEDICATIONS:  1. Meloxicam at home.  2. Tramadol p.r.n.  3. Losartan 100 daily which she stopped taking about a week ago.  4. Atorvastatin 20 daily.   SOCIAL HISTORY: No smoking. No alcohol or drug use.   FAMILY HISTORY: There is no history of ulcerative colitis or Crohn's disease. No gastrointestinal cancer. There was coronary artery disease in the father.   REVIEW OF SYSTEMS: CONSTITUTIONAL: Complains of low-grade fevers, fatigue, and weakness. Abdominal pain. No significant weight loss or weight gain. EYES: No blurred or double vision. No pain. No redness. No inflammation. No glaucoma. Does have cataracts. ENT: No tinnitus. No ear pain. No hearing loss. No allergies, seasonal or year-round. No epistaxis. No nasal discharge. No snoring. No postnasal drip. RESPIRATORY: No cough. No wheezing. No hemoptysis. No chronic obstructive pulmonary disease. No tuberculosis. No pneumonia. CARDIOVASCULAR: No chest pain. No orthopnea. No edema. No arrhythmia. No palpitations. No syncope. GASTROINTESTINAL: No nausea or vomiting. Does have diarrhea. Complains of diffuse abdominal pain. No hematemesis. No melena. No gastroesophageal reflux disease. No irritable bowel syndrome. No jaundice. No rectal bleeding. No hemorrhoids. GENITOURINARY: Denies any dysuria, hematuria, renal calculus, or frequency. ENDOCRINE: Denies any polydipsia, nocturia, or thyroid problems. HEME/LYMPH: Denies any anemia, easy bruisability, or bleeding. MUSCULOSKELETAL: Does have pain related to arthritis. Denies any gout or redness. NEUROLOGIC: No numbness. No cerebrovascular accident. No transient ischemic attack. No seizures. PSYCHIATRIC: No anxiety. No insomnia. No ADD. No OCD.   PHYSICAL  EXAMINATION:  VITAL SIGNS: Temperature 100.4, pulse 65, respirations 18, blood pressure 94/50, O2 94% on room air.   GENERAL: The patient is a well developed Caucasian female, appears weak,  in no acute distress.   HEENT: Head atraumatic, normocephalic. Pupils equally round, reactive to light and accommodation. Extraocular movements intact. There is no conjunctival pallor. No scleral icterus.   OROPHARYNX: Clear without any exudates.   NECK: No thyromegaly. No carotid bruits.   CARDIOVASCULAR: Regular rate and rhythm. No murmurs, rubs, clicks, or gallops. PMI is not displaced.   LUNGS: Clear to auscultation bilaterally without any rales, rhonchi, or wheezing.   ABDOMEN: Mild tenderness diffusely. There is no guarding or rebound.   EXTREMITIES: No clubbing, cyanosis, or edema.   SKIN: No rash.   LYMPHATICS: No lymph nodes palpable.   VASCULAR: Good DP, PT pulses.   PSYCHIATRIC: Not anxious or depressed.   NEUROLOGIC: Cranial nerves II through XII grossly intact. No focal deficits.   LABORATORY, RADIOLOGICAL AND DIAGNOSTIC DATA: Evaluation in the ED: CT scan of the abdomen and pelvis shows pancolitis. Urinalysis: Ketones 1+, nitrates negative, leukocytes negative. WBC 6.8, hemoglobin 11.6, platelet count 512, glucose 107, BUN 13, creatinine 0.71, sodium 131, potassium 3.9, chloride 96, CO2 25. LFTs showed total protein of 5.5, albumin 1.8, lipase 40.   ASSESSMENT AND PLAN: The patient is a 69 year old white female who presents with diarrhea, noted to have pancolitis.  1. Pancolitis, possibly etiology infectious, inflammatory. At this time will get stool studies including repeating the Clostridium difficile. Will place her on empiric antibiotics with Cipro and Flagyl as recommended by Dr. Candace Cruise per the ED physician. The patient may need a colonoscopy or sigmoidoscopy to get a pathology if the symptoms do not improve.  2. Hypertension, currently hypotensive. We will keep her off her losartan. Give her IV fluids.  3. Hyperlipidemia. We will hold atorvastatin for the time being in light of her gastrointestinal issues.  4. History of PMR, currently not active and not on any  steroids for a few years now.  5. Miscellaneous. Will place her on heparin for deep vein thrombosis prophylaxis.   TIME SPENT: 35 minutes.   ____________________________ Lafonda Mosses Posey Pronto, MD shp:ap D: 09/19/2011 21:41:50 ET T: 09/20/2011 09:13:58 ET JOB#: [id number]  cc: Konnar Ben H. Posey Pronto, MD, <Dictator> Deborra Medina, MD  Alric Seton MD ELECTRONICALLY SIGNED 09/24/2011 13:17

## 2014-09-20 ENCOUNTER — Encounter: Payer: Medicare Other | Admitting: Internal Medicine

## 2014-09-22 ENCOUNTER — Ambulatory Visit (INDEPENDENT_AMBULATORY_CARE_PROVIDER_SITE_OTHER): Payer: PPO | Admitting: Internal Medicine

## 2014-09-22 ENCOUNTER — Encounter: Payer: Self-pay | Admitting: Internal Medicine

## 2014-09-22 VITALS — BP 138/88 | HR 63 | Temp 98.6°F | Resp 14 | Ht 62.25 in | Wt 134.2 lb

## 2014-09-22 DIAGNOSIS — Z23 Encounter for immunization: Secondary | ICD-10-CM | POA: Diagnosis not present

## 2014-09-22 DIAGNOSIS — I10 Essential (primary) hypertension: Secondary | ICD-10-CM

## 2014-09-22 DIAGNOSIS — R5383 Other fatigue: Secondary | ICD-10-CM

## 2014-09-22 DIAGNOSIS — E785 Hyperlipidemia, unspecified: Secondary | ICD-10-CM

## 2014-09-22 DIAGNOSIS — E559 Vitamin D deficiency, unspecified: Secondary | ICD-10-CM | POA: Diagnosis not present

## 2014-09-22 DIAGNOSIS — Z0000 Encounter for general adult medical examination without abnormal findings: Secondary | ICD-10-CM | POA: Diagnosis not present

## 2014-09-22 NOTE — Progress Notes (Signed)
Patient ID: Kaitlyn Roman, female   DOB: Apr 13, 1946, 69 y.o.   MRN: [id number]   The patient is here for annual Medicare wellness examination and management of other chronic and acute problems.  HAS BEEN TAKING 1/2 LOSARTAN AND 1/2 ATORVASTATIN INSTEAD OF FULL TABLETS.  NO ER VISITS ,  NO FALLS ,      The risk factors are reflected in the social history.  The roster of all physicians providing medical care to patient - is listed in the Snapshot section of the chart.  Activities of daily living:  The patient is 100% independent in all ADLs: dressing, toileting, feeding as well as independent mobility  Home safety : The patient has smoke detectors in the home. They wear seatbelts.  There are no firearms at home. There is no violence in the home.   There is no risks for hepatitis, STDs or HIV. There is no   history of blood transfusion. They have no travel history to infectious disease endemic areas of the world.  The patient has seen their dentist in the last six month. They have seen their eye doctor in the last year. They admit to slight hearing difficulty with regard to whispered voices and some television programs.  They have deferred audiologic testing in the last year.  They do not  have excessive sun exposure. Discussed the need for sun protection: hats, long sleeves and use of sunscreen if there is significant sun exposure.   Diet: the importance of a healthy diet is discussed. They do have a healthy diet.  The benefits of regular aerobic exercise were discussed. She walks 4 times per week ,  20 minutes.   Depression screen: there are no signs or vegative symptoms of depression- irritability, change in appetite, anhedonia, sadness/tearfullness.  Cognitive assessment: the patient manages all their financial and personal affairs and is actively engaged. They could relate day,date,year and events; recalled 2/3 objects at 3 minutes; performed clock-face test normally.  The following  portions of the patient's history were reviewed and updated as appropriate: allergies, current medications, past family history, past medical history,  past surgical history, past social history  and problem list.  Visual acuity was not assessed per patient preference since she has regular follow up with her ophthalmologist. Hearing and body mass index were assessed and reviewed.   During the course of the visit the patient was educated and counseled about appropriate screening and preventive services including : fall prevention , diabetes screening, nutrition counseling, colorectal cancer screening, and recommended immunizations.    Review of Systems:  Patient denies headache, fevers, malaise, unintentional weight loss, skin rash, eye pain, sinus congestion and sinus pain, sore throat, dysphagia,  hemoptysis , cough, dyspnea, wheezing, chest pain, palpitations, orthopnea, edema, abdominal pain, nausea, melena, diarrhea, constipation, flank pain, dysuria, hematuria, urinary  Frequency, nocturia, numbness, tingling, seizures,  Focal weakness, Loss of consciousness,  Tremor, insomnia, depression, anxiety, and suicidal ideation.  Objective:  BP 138/88 mmHg  Pulse 63  Temp(Src) 98.6 F (37 C) (Oral)  Resp 14  Ht 5' 2.25" (1.581 m)  Wt 134 lb 4 oz (60.895 kg)  BMI 24.36 kg/m2  SpO2 99%  General appearance: alert, cooperative and appears stated age Head: Normocephalic, without obvious abnormality, atraumatic Eyes: conjunctivae/corneas clear. PERRL, EOM's intact. Fundi benign. Ears: normal TM's and external ear canals both ears Nose: Nares normal. Septum midline. Mucosa normal. No drainage or sinus tenderness. Throat: lips, mucosa, and tongue normal; teeth and gums normal Neck: no adenopathy,  no carotid bruit, no JVD, supple, symmetrical, trachea midline and thyroid not enlarged, symmetric, no tenderness/mass/nodules Lungs: clear to auscultation bilaterally Breasts: normal appearance, no  masses or tenderness Heart: regular rate and rhythm, S1, S2 normal, no murmur, click, rub or gallop Abdomen: soft, non-tender; bowel sounds normal; no masses,  no organomegaly Extremities: extremities normal, atraumatic, no cyanosis or edema Pulses: 2+ and symmetric Skin: Skin color, texture, turgor normal. No rashes or lesions Neurologic: Alert and oriented X 3, normal strength and tone. Normal symmetric reflexes. Normal coordination and gait.    Assessment and Plan:  Problem List Items Addressed This Visit    Hyperlipidemia    Well controlled on current statin therapy.   Liver enzymes are normal , no changes today.  Lab Results  Component Value Date   CHOL 190 09/22/2014   HDL 72.40 09/22/2014   LDLCALC 97 09/22/2014   LDLDIRECT 123.1 02/24/2013   TRIG 103.0 09/22/2014   CHOLHDL 3 09/22/2014   Lab Results  Component Value Date   ALT 14 09/22/2014   AST 26 09/22/2014   ALKPHOS 60 09/22/2014   BILITOT 0.5 09/22/2014         Relevant Medications   losartan (COZAAR) 50 MG tablet   Other Relevant Orders   Lipid panel (Completed)   Hypertension    BP 138/88 mmHg  Pulse 63  Temp(Src) 98.6 F (37 C) (Oral)  Resp 14  Ht 5' 2.25" (1.581 m)  Wt 134 lb 4 oz (60.895 kg)  BMI 24.36 kg/m2  SpO2 99%  Well controlled on current regimen. Renal function stable, no changes today.  Lab Results  Component Value Date   CREATININE 0.68 09/22/2014   Lab Results  Component Value Date   NA 137 09/22/2014   K 4.3 09/22/2014   CL 101 09/22/2014   CO2 30 09/22/2014         Relevant Medications   losartan (COZAAR) 50 MG tablet   Other Relevant Orders   Comprehensive metabolic panel (Completed)   Medicare annual wellness visit, subsequent    Annual Medicare wellness  exam was done as well as a comprehensive physical exam and management of acute and chronic conditions .  During the course of the visit the patient was educated and counseled about appropriate screening and  preventive services including : fall prevention , diabetes screening, nutrition counseling, colorectal cancer screening, and recommended immunizations.  Printed recommendations for health maintenance screenings was given.        Other Visit Diagnoses    Other fatigue    -  Primary    Relevant Orders    CBC with Differential/Platelet (Completed)    TSH (Completed)    Vitamin D deficiency        Relevant Orders    Vit D  25 hydroxy (rtn osteoporosis monitoring) (Completed)    Need for prophylactic vaccination against Streptococcus pneumoniae (pneumococcus)        Relevant Orders    Pneumococcal conjugate vaccine 13-valent (Completed)

## 2014-09-22 NOTE — Patient Instructions (Signed)
You received the pneumonia vaccine today (Prevnar)   I recommend getting the majority of your calcium and Vitamin D  through diet rather than supplements given the recent association of calcium supplements with increased coronary artery calcium scores.  You need 1200 mg calcium daily adn 1000 IUs of Vit D3 daily )  Try the [employer] brand :  Kaitlyn Roman,  Original formula.  Delicious,  Low carb,  Low cal,  Cholesterol free!  433 mg calcium per 8 ounce serving (more than a glass of milk)   Health Maintenance Adopting a healthy lifestyle and getting preventive care can go a long way to promote health and wellness. Talk with your health care provider about what schedule of regular examinations is right for you. This is a good chance for you to check in with your provider about disease prevention and staying healthy. In between checkups, there are plenty of things you can do on your own. Experts have done a lot of research about which lifestyle changes and preventive measures are most likely to keep you healthy. Ask your health care provider for more information. WEIGHT AND DIET  Eat a healthy diet  Be sure to include plenty of vegetables, fruits, low-fat dairy products, and lean protein.  Do not eat a lot of foods high in solid fats, added sugars, or salt.  Get regular exercise. This is one of the most important things you can do for your health.  Most adults should exercise for at least 150 minutes each week. The exercise should increase your heart rate and make you sweat (moderate-intensity exercise).  Most adults should also do strengthening exercises at least twice a week. This is in addition to the moderate-intensity exercise.  Maintain a healthy weight  Body mass index (BMI) is a measurement that can be used to identify possible weight problems. It estimates body fat based on height and weight. Your health care provider can help determine your BMI and help you achieve or maintain a healthy  weight.  For females 8 years of age and older:   A BMI below 18.5 is considered underweight.  A BMI of 18.5 to 24.9 is normal.  A BMI of 25 to 29.9 is considered overweight.  A BMI of 30 and above is considered obese.  Watch levels of cholesterol and blood lipids  You should start having your blood tested for lipids and cholesterol at 71 years of age, then have this test every 5 years.  You may need to have your cholesterol levels checked more often if:  Your lipid or cholesterol levels are high.  You are older than 71 years of age.  You are at high risk for heart disease.  CANCER SCREENING   Lung Cancer  Lung cancer screening is recommended for adults 7-53 years old who are at high risk for lung cancer because of a history of smoking.  A yearly low-dose CT scan of the lungs is recommended for people who:  Currently smoke.  Have quit within the past 15 years.  Have at least a 30-pack-year history of smoking. A pack year is smoking an average of one pack of cigarettes a day for 1 year.  Yearly screening should continue until it has been 15 years since you quit.  Yearly screening should stop if you develop a health problem that would prevent you from having lung cancer treatment.  Breast Cancer  Practice breast self-awareness. This means understanding how your breasts normally appear and feel.  It also means  doing regular breast self-exams. Let your health care provider know about any changes, no matter how small.  If you are in your 20s or 30s, you should have a clinical breast exam (CBE) by a health care provider every 1-3 years as part of a regular health exam.  If you are 18 or older, have a CBE every year. Also consider having a breast X-ray (mammogram) every year.  If you have a family history of breast cancer, talk to your health care provider about genetic screening.  If you are at high risk for breast cancer, talk to your health care provider about  having an MRI and a mammogram every year.  Breast cancer gene (BRCA) assessment is recommended for women who have family members with BRCA-related cancers. BRCA-related cancers include:  Breast.  Ovarian.  Tubal.  Peritoneal cancers.  Results of the assessment will determine the need for genetic counseling and BRCA1 and BRCA2 testing. Cervical Cancer Routine pelvic examinations to screen for cervical cancer are no longer recommended for nonpregnant women who are considered low risk for cancer of the pelvic organs (ovaries, uterus, and vagina) and who do not have symptoms. A pelvic examination may be necessary if you have symptoms including those associated with pelvic infections. Ask your health care provider if a screening pelvic exam is right for you.   The Pap test is the screening test for cervical cancer for women who are considered at risk.  If you had a hysterectomy for a problem that was not cancer or a condition that could lead to cancer, then you no longer need Pap tests.  If you are older than 65 years, and you have had normal Pap tests for the past 10 years, you no longer need to have Pap tests.  If you have had past treatment for cervical cancer or a condition that could lead to cancer, you need Pap tests and screening for cancer for at least 20 years after your treatment.  If you no longer get a Pap test, assess your risk factors if they change (such as having a new sexual partner). This can affect whether you should start being screened again.  Some women have medical problems that increase their chance of getting cervical cancer. If this is the case for you, your health care provider may recommend more frequent screening and Pap tests.  The human papillomavirus (HPV) test is another test that may be used for cervical cancer screening. The HPV test looks for the virus that can cause cell changes in the cervix. The cells collected during the Pap test can be tested for  HPV.  The HPV test can be used to screen women 7 years of age and older. Getting tested for HPV can extend the interval between normal Pap tests from three to five years.  An HPV test also should be used to screen women of any age who have unclear Pap test results.  After 71 years of age, women should have HPV testing as often as Pap tests.  Colorectal Cancer  This type of cancer can be detected and often prevented.  Routine colorectal cancer screening usually begins at 71 years of age and continues through 71 years of age.  Your health care provider may recommend screening at an earlier age if you have risk factors for colon cancer.  Your health care provider may also recommend using home test kits to check for hidden blood in the stool.  A small camera at the end of a  tube can be used to examine your colon directly (sigmoidoscopy or colonoscopy). This is done to check for the earliest forms of colorectal cancer.  Routine screening usually begins at age 53.  Direct examination of the colon should be repeated every 5-10 years through 71 years of age. However, you may need to be screened more often if early forms of precancerous polyps or small growths are found. Skin Cancer  Check your skin from head to toe regularly.  Tell your health care provider about any new moles or changes in moles, especially if there is a change in a mole's shape or color.  Also tell your health care provider if you have a mole that is larger than the size of a pencil eraser.  Always use sunscreen. Apply sunscreen liberally and repeatedly throughout the day.  Protect yourself by wearing long sleeves, pants, a wide-brimmed hat, and sunglasses whenever you are outside. HEART DISEASE, DIABETES, AND HIGH BLOOD PRESSURE   Have your blood pressure checked at least every 1-2 years. High blood pressure causes heart disease and increases the risk of stroke.  If you are between 29 years and 30 years old, ask  your health care provider if you should take aspirin to prevent strokes.  Have regular diabetes screenings. This involves taking a blood sample to check your fasting blood sugar level.  If you are at a normal weight and have a low risk for diabetes, have this test once every three years after 71 years of age.  If you are overweight and have a high risk for diabetes, consider being tested at a younger age or more often. PREVENTING INFECTION  Hepatitis B  If you have a higher risk for hepatitis B, you should be screened for this virus. You are considered at high risk for hepatitis B if:  You were born in a country where hepatitis B is common. Ask your health care provider which countries are considered high risk.  Your parents were born in a high-risk country, and you have not been immunized against hepatitis B (hepatitis B vaccine).  You have HIV or AIDS.  You use needles to inject street drugs.  You live with someone who has hepatitis B.  You have had sex with someone who has hepatitis B.  You get hemodialysis treatment.  You take certain medicines for conditions, including cancer, organ transplantation, and autoimmune conditions. Hepatitis C  Blood testing is recommended for:  Everyone born from [birth year] through 1967.  Anyone with known risk factors for hepatitis C. Sexually transmitted infections (STIs)  You should be screened for sexually transmitted infections (STIs) including gonorrhea and chlamydia if:  You are sexually active and are younger than 71 years of age.  You are older than 71 years of age and your health care provider tells you that you are at risk for this type of infection.  Your sexual activity has changed since you were last screened and you are at an increased risk for chlamydia or gonorrhea. Ask your health care provider if you are at risk.  If you do not have HIV, but are at risk, it may be recommended that you take a prescription medicine daily to  prevent HIV infection. This is called pre-exposure prophylaxis (PrEP). You are considered at risk if:  You are sexually active and do not regularly use condoms or know the HIV status of your partner(s).  You take drugs by injection.  You are sexually active with a partner who has HIV. Talk with  your health care provider about whether you are at high risk of being infected with HIV. If you choose to begin PrEP, you should first be tested for HIV. You should then be tested every 3 months for as long as you are taking PrEP.  PREGNANCY   If you are premenopausal and you may become pregnant, ask your health care provider about preconception counseling.  If you may become pregnant, take 400 to 800 micrograms (mcg) of folic acid every day.  If you want to prevent pregnancy, talk to your health care provider about birth control (contraception). OSTEOPOROSIS AND MENOPAUSE   Osteoporosis is a disease in which the bones lose minerals and strength with aging. This can result in serious bone fractures. Your risk for osteoporosis can be identified using a bone density scan.  If you are 37 years of age or older, or if you are at risk for osteoporosis and fractures, ask your health care provider if you should be screened.  Ask your health care provider whether you should take a calcium or vitamin D supplement to lower your risk for osteoporosis.  Menopause may have certain physical symptoms and risks.  Hormone replacement therapy may reduce some of these symptoms and risks. Talk to your health care provider about whether hormone replacement therapy is right for you.  HOME CARE INSTRUCTIONS   Schedule regular health, dental, and eye exams.  Stay current with your immunizations.   Do not use any tobacco products including cigarettes, chewing tobacco, or electronic cigarettes.  If you are pregnant, do not drink alcohol.  If you are breastfeeding, limit how much and how often you drink  alcohol.  Limit alcohol intake to no more than 1 drink per day for nonpregnant women. One drink equals 12 ounces of beer, 5 ounces of wine, or 1 ounces of hard liquor.  Do not use street drugs.  Do not share needles.  Ask your health care provider for help if you need support or information about quitting drugs.  Tell your health care provider if you often feel depressed.  Tell your health care provider if you have ever been abused or do not feel safe at home. Document Released: 11/27/2010 Document Revised: 09/28/2013 Document Reviewed: 04/15/2013 [facility] Patient Information 2015 [place], [place]. This information is not intended to replace advice given to you by your health care provider. Make sure you discuss any questions you have with your health care provider.

## 2014-09-22 NOTE — Progress Notes (Signed)
Pre-visit discussion using our clinic review tool. No additional management support is needed unless otherwise documented below in the visit note.  

## 2014-09-23 LAB — CBC WITH DIFFERENTIAL/PLATELET
Basophils Absolute: 0 10*3/uL (ref 0.0–0.1)
Basophils Relative: 0.9 % (ref 0.0–3.0)
Eosinophils Absolute: 0.1 10*3/uL (ref 0.0–0.7)
Eosinophils Relative: 1.5 % (ref 0.0–5.0)
HCT: 39.3 % (ref 36.0–46.0)
Hemoglobin: 13.4 g/dL (ref 12.0–15.0)
Lymphocytes Relative: 37.2 % (ref 12.0–46.0)
Lymphs Abs: 1.9 10*3/uL (ref 0.7–4.0)
MCHC: 34 g/dL (ref 30.0–36.0)
MCV: 95.1 fl (ref 78.0–100.0)
Monocytes Absolute: 0.4 10*3/uL (ref 0.1–1.0)
Monocytes Relative: 7 % (ref 3.0–12.0)
Neutro Abs: 2.8 10*3/uL (ref 1.4–7.7)
Neutrophils Relative %: 53.4 % (ref 43.0–77.0)
Platelets: 252 10*3/uL (ref 150.0–400.0)
RBC: 4.13 Mil/uL (ref 3.87–5.11)
RDW: 13.3 % (ref 11.5–15.5)
WBC: 5.2 10*3/uL (ref 4.0–10.5)

## 2014-09-23 LAB — COMPREHENSIVE METABOLIC PANEL
ALT: 14 U/L (ref 0–35)
AST: 26 U/L (ref 0–37)
Albumin: 4.3 g/dL (ref 3.5–5.2)
Alkaline Phosphatase: 60 U/L (ref 39–117)
BUN: 26 mg/dL — ABNORMAL HIGH (ref 6–23)
CO2: 30 mEq/L (ref 19–32)
Calcium: 9.7 mg/dL (ref 8.4–10.5)
Chloride: 101 mEq/L (ref 96–112)
Creatinine, Ser: 0.68 mg/dL (ref 0.40–1.20)
GFR: 90.62 mL/min (ref >60.00)
Glucose, Bld: 90 mg/dL (ref 70–99)
Potassium: 4.3 mEq/L (ref 3.5–5.1)
Sodium: 137 mEq/L (ref 135–145)
Total Bilirubin: 0.5 mg/dL (ref 0.2–1.2)
Total Protein: 6.9 g/dL (ref 6.0–8.3)

## 2014-09-23 LAB — LIPID PANEL
Cholesterol: 190 mg/dL (ref 0–200)
HDL: 72.4 mg/dL (ref >39.00)
LDL Cholesterol: 97 mg/dL (ref 0–99)
NonHDL: 117.6
Total CHOL/HDL Ratio: 3
Triglycerides: 103 mg/dL (ref 0.0–149.0)
VLDL: 20.6 mg/dL (ref 0.0–40.0)

## 2014-09-23 LAB — TSH: TSH: 2.42 u[IU]/mL (ref 0.35–4.50)

## 2014-09-23 LAB — VITAMIN D 25 HYDROXY (VIT D DEFICIENCY, FRACTURES): VITD: 39.43 ng/mL (ref 30.00–100.00)

## 2014-09-25 ENCOUNTER — Encounter: Payer: Self-pay | Admitting: Internal Medicine

## 2014-09-25 DIAGNOSIS — Z0000 Encounter for general adult medical examination without abnormal findings: Secondary | ICD-10-CM | POA: Insufficient documentation

## 2014-09-25 MED ORDER — LOSARTAN POTASSIUM 50 MG PO TABS: ORAL_TABLET | ORAL | Status: DC

## 2014-09-25 NOTE — Assessment & Plan Note (Signed)
BP 138/88 mmHg  Pulse 63  Temp(Src) 98.6 F (37 C) (Oral)  Resp 14  Ht 5' 2.25" (1.581 m)  Wt 134 lb 4 oz (60.895 kg)  BMI 24.36 kg/m2  SpO2 99%  Well controlled on current regimen. Renal function stable, no changes today.  Lab Results  Component Value Date   CREATININE 0.68 09/22/2014   Lab Results  Component Value Date   NA 137 09/22/2014   K 4.3 09/22/2014   CL 101 09/22/2014   CO2 30 09/22/2014

## 2014-09-25 NOTE — Assessment & Plan Note (Signed)

## 2014-09-25 NOTE — Assessment & Plan Note (Signed)
Well controlled on current statin therapy.   Liver enzymes are normal , no changes today.  Lab Results  Component Value Date   CHOL 190 09/22/2014   HDL 72.40 09/22/2014   LDLCALC 97 09/22/2014   LDLDIRECT 123.1 02/24/2013   TRIG 103.0 09/22/2014   CHOLHDL 3 09/22/2014   Lab Results  Component Value Date   ALT 14 09/22/2014   AST 26 09/22/2014   ALKPHOS 60 09/22/2014   BILITOT 0.5 09/22/2014

## 2014-09-26 ENCOUNTER — Telehealth: Payer: Self-pay | Admitting: Internal Medicine

## 2014-09-26 NOTE — Telephone Encounter (Signed)
Your vitamin D, thyroid , cholesterol, liver and kidney function are normal. You can continue 1/2 lipitor dose and I have reduced the losartan dose and sent new rx for 50 mg to pharmacy

## 2014-09-27 NOTE — Telephone Encounter (Signed)
Called and advised patient of labs and medication also patient requested copy of labs copy mailed to patient as requested.

## 2014-11-17 ENCOUNTER — Telehealth: Payer: Self-pay | Admitting: Internal Medicine

## 2014-11-17 NOTE — Telephone Encounter (Signed)
Pt dropped off letter. Letter in Dr. Lupita Dawn box/msn

## 2014-11-18 NOTE — Telephone Encounter (Signed)
In red folder. 

## 2014-11-19 NOTE — Telephone Encounter (Signed)
Patient notified and voiced understanding and stated she will probably  Not take but will advise if she starts medication. FYI

## 2014-11-19 NOTE — Telephone Encounter (Signed)
Please tell her that the active ingredient she has inquired about  has not been formally evaluated so i cannot advise her on whether it is safe to taek. i would advise her that if she decides to take it,  She should get thyroid liver and kideny function checked after 3 weeks

## 2015-03-21 ENCOUNTER — Other Ambulatory Visit: Payer: Self-pay | Admitting: Internal Medicine

## 2015-03-21 ENCOUNTER — Other Ambulatory Visit (INDEPENDENT_AMBULATORY_CARE_PROVIDER_SITE_OTHER): Payer: PPO

## 2015-03-21 ENCOUNTER — Telehealth: Payer: Self-pay | Admitting: *Deleted

## 2015-03-21 DIAGNOSIS — I10 Essential (primary) hypertension: Secondary | ICD-10-CM

## 2015-03-21 DIAGNOSIS — E559 Vitamin D deficiency, unspecified: Secondary | ICD-10-CM | POA: Diagnosis not present

## 2015-03-21 LAB — VITAMIN D 25 HYDROXY (VIT D DEFICIENCY, FRACTURES): VITD: 42.81 ng/mL (ref 30.00–100.00)

## 2015-03-21 LAB — COMPREHENSIVE METABOLIC PANEL
ALT: 10 U/L (ref 0–35)
AST: 19 U/L (ref 0–37)
Albumin: 4.1 g/dL (ref 3.5–5.2)
Alkaline Phosphatase: 63 U/L (ref 39–117)
BUN: 25 mg/dL — ABNORMAL HIGH (ref 6–23)
CO2: 29 mEq/L (ref 19–32)
Calcium: 9.4 mg/dL (ref 8.4–10.5)
Chloride: 103 mEq/L (ref 96–112)
Creatinine, Ser: 0.63 mg/dL (ref 0.40–1.20)
GFR: 98.83 mL/min (ref >60.00)
Glucose, Bld: 94 mg/dL (ref 70–99)
Potassium: 4.8 mEq/L (ref 3.5–5.1)
Sodium: 141 mEq/L (ref 135–145)
Total Bilirubin: 0.6 mg/dL (ref 0.2–1.2)
Total Protein: 6.6 g/dL (ref 6.0–8.3)

## 2015-03-21 NOTE — Telephone Encounter (Signed)
Labs and dx?  

## 2015-03-24 ENCOUNTER — Ambulatory Visit: Payer: PPO | Admitting: Internal Medicine

## 2015-03-28 ENCOUNTER — Ambulatory Visit (INDEPENDENT_AMBULATORY_CARE_PROVIDER_SITE_OTHER): Payer: PPO | Admitting: Internal Medicine

## 2015-03-28 ENCOUNTER — Encounter: Payer: Self-pay | Admitting: Internal Medicine

## 2015-03-28 ENCOUNTER — Encounter (INDEPENDENT_AMBULATORY_CARE_PROVIDER_SITE_OTHER): Payer: Self-pay

## 2015-03-28 VITALS — BP 144/86 | HR 62 | Temp 98.4°F | Resp 12 | Ht 62.0 in | Wt 136.1 lb

## 2015-03-28 DIAGNOSIS — M159 Polyosteoarthritis, unspecified: Secondary | ICD-10-CM

## 2015-03-28 DIAGNOSIS — Z23 Encounter for immunization: Secondary | ICD-10-CM

## 2015-03-28 DIAGNOSIS — M150 Primary generalized (osteo)arthritis: Secondary | ICD-10-CM

## 2015-03-28 DIAGNOSIS — M8949 Other hypertrophic osteoarthropathy, multiple sites: Secondary | ICD-10-CM

## 2015-03-28 DIAGNOSIS — I10 Essential (primary) hypertension: Secondary | ICD-10-CM

## 2015-03-28 DIAGNOSIS — E785 Hyperlipidemia, unspecified: Secondary | ICD-10-CM

## 2015-03-28 NOTE — Progress Notes (Signed)
Subjective:  Patient ID: Kaitlyn Roman, female    DOB: 1946/03/27  Age: 69 y.o. MRN: [id number]  CC: The primary encounter diagnosis was Encounter for immunization. Diagnoses of Essential hypertension, Hyperlipidemia, and Primary osteoarthritis involving multiple joints were also pertinent to this visit.  HPI Kaitlyn Roman presents for follow up on hypertension and hyperliopidemia. Patient has reduced her losatan dose to half but taking her other medications as prescribed and notes no adverse effects.  Home BP readings have been done about once per week and are  generally > 130/80 .  She is avoiding added salt in her diet and working out regularly about 5 times per week for exercise  .     Outpatient Prescriptions Prior to Visit  Medication Sig Dispense Refill  . aspirin 81 MG tablet Take 81 mg by mouth daily.    [name] atorvastatin (LIPITOR) 40 MG tablet TAKE 1 TABLET EVERY DAY 90 tablet 1  . Cholecalciferol (VITAMIN D-3) 1000 UNITS CAPS Take 1 capsule by mouth daily.    [name] doxylamine, Sleep, (UNISOM) 25 MG tablet Take 12.5 mg by mouth at bedtime as needed for sleep.     . fluticasone (FLONASE) 50 MCG/ACT nasal spray Place 2 sprays into both nostrils daily. 16 g 6  . losartan (COZAAR) 50 MG tablet TAKE 1 TABLET EVERY DAY 90 tablet 1  . meloxicam (MOBIC) 15 MG tablet Take 1 tablet (15 mg total) by mouth daily. 90 tablet 1  . Probiotic Product (PROBIOTIC PO) Take 1 capsule by mouth daily.    . TDaP (BOOSTRIX) 5-2.5-18.5 LF-MCG/0.5 injection Inject 0.5 mLs into the muscle once. 0.5 mL 0  . cyanocobalamin 1000 MCG tablet Take 1,000 mcg by mouth daily.    [name] dextromethorphan-guaiFENesin (MUCINEX DM) 30-600 MG per 12 hr tablet Take 1 tablet by mouth 2 (two) times daily as needed for cough.    . estradiol (ESTRACE) 0.5 MG tablet Take 0.5 tablets (0.25 mg total) by mouth daily. 90 tablet 1  . Omega-3 Fatty Acids (FISH OIL) 1000 MG CAPS Take 1 capsule by mouth daily.    . phenylephrine (SUDAFED PE) 10  MG TABS tablet Take 10 mg by mouth every 4 (four) hours as needed.    [name] guaiFENesin-codeine 100-10 MG/5ML syrup Take 5 mLs by mouth 3 (three) times daily as needed. (Patient not taking: Reported on 09/22/2014) 120 mL 0   No facility-administered medications prior to visit.    Review of Systems;  Patient denies headache, fevers, malaise, unintentional weight loss, skin rash, eye pain, sinus congestion and sinus pain, sore throat, dysphagia,  hemoptysis , cough, dyspnea, wheezing, chest pain, palpitations, orthopnea, edema, abdominal pain, nausea, melena, diarrhea, constipation, flank pain, dysuria, hematuria, urinary  Frequency, nocturia, numbness, tingling, seizures,  Focal weakness, Loss of consciousness,  Tremor, insomnia, depression, anxiety, and suicidal ideation.      Objective:  BP 144/86 mmHg  Pulse 62  Temp(Src) 98.4 F (36.9 C) (Oral)  Resp 12  Ht 5\' 2"  (1.575 m)  Wt 136 lb 2 oz (61.746 kg)  BMI 24.89 kg/m2  SpO2 99%  BP Readings from Last 3 Encounters:  03/28/15 144/86  09/22/14 138/88  10/05/13 124/78    Wt Readings from Last 3 Encounters:  03/28/15 136 lb 2 oz (61.746 kg)  09/22/14 134 lb 4 oz (60.895 kg)  10/05/13 129 lb 12 oz (58.854 kg)    General appearance: alert, cooperative and appears stated age Ears: normal TM's and external ear canals both ears Throat: lips,  mucosa, and tongue normal; teeth and gums normal Neck: no adenopathy, no carotid bruit, supple, symmetrical, trachea midline and thyroid not enlarged, symmetric, no tenderness/mass/nodules Back: symmetric, no curvature. ROM normal. No CVA tenderness. Lungs: clear to auscultation bilaterally Heart: regular rate and rhythm, S1, S2 normal, no murmur, click, rub or gallop Abdomen: soft, non-tender; bowel sounds normal; no masses,  no organomegaly Pulses: 2+ and symmetric Skin: Skin color, texture, turgor normal. No rashes or lesions Lymph nodes: Cervical, supraclavicular, and axillary nodes  normal.  No results found for: HGBA1C  Lab Results  Component Value Date   CREATININE 0.63 03/21/2015   CREATININE 0.68 09/22/2014   CREATININE 0.7 03/02/2014    Lab Results  Component Value Date   WBC 5.2 09/22/2014   HGB 13.4 09/22/2014   HCT 39.3 09/22/2014   PLT 252.0 09/22/2014   GLUCOSE 94 03/21/2015   CHOL 190 09/22/2014   TRIG 103.0 09/22/2014   HDL 72.40 09/22/2014   LDLDIRECT 123.1 02/24/2013   LDLCALC 97 09/22/2014   ALT 10 03/21/2015   AST 19 03/21/2015   NA 141 03/21/2015   K 4.8 03/21/2015   CL 103 03/21/2015   CREATININE 0.63 03/21/2015   BUN 25* 03/21/2015   CO2 29 03/21/2015   TSH 2.42 09/22/2014    No results found.  Assessment & Plan:   Problem List Items Addressed This Visit    Hyperlipidemia    Managed with 1/2 atorvastatin tablet (20 mg dose) .  Liver enzymes normal.  No changes today  Lab Results  Component Value Date   CHOL 190 09/22/2014   HDL 72.40 09/22/2014   LDLCALC 97 09/22/2014   LDLDIRECT 123.1 02/24/2013   TRIG 103.0 09/22/2014   CHOLHDL 3 09/22/2014   Lab Results  Component Value Date   ALT 10 03/21/2015   AST 19 03/21/2015   ALKPHOS 63 03/21/2015   BILITOT 0.6 03/21/2015         Hypertension    Advised to increase the losartan to 50 mg daily       OA (osteoarthritis)    Managed with meloxicam.  No G I upset.        Other Visit Diagnoses    Encounter for immunization    -  Primary     A total of 25 minutes of face to face time was spent with patient more than half of which was spent in counselling about the above mentioned conditions  and coordination of care   I have discontinued [name]'s Tdap and guaiFENesin-codeine. I am also having her maintain her aspirin, doxylamine (Sleep), Fish Oil, Vitamin D-3, phenylephrine, dextromethorphan-guaiFENesin, meloxicam, estradiol, atorvastatin, fluticasone, cyanocobalamin, Probiotic Product (PROBIOTIC PO), and losartan.  No orders of the defined types were  placed in this encounter.    Medications Discontinued During This Encounter  Medication Reason  . TDaP (BOOSTRIX) 5-2.5-18.5 LF-MCG/0.5 injection Error  . guaiFENesin-codeine 100-10 MG/5ML syrup     Follow-up: Return in about 6 months (around 09/25/2015).   Crecencio Mc, MD

## 2015-03-28 NOTE — Patient Instructions (Addendum)
Your blood pressure is not quite at goal   Please increase the losartan to a full tablet,  Daily.  If home readings are < 110/70 on full tablet,  Then  Call for change in medication .    continue 1/2 tablet of lipitor daily .

## 2015-03-29 NOTE — Assessment & Plan Note (Signed)
Advised to increase the losartan to 50 mg daily

## 2015-03-29 NOTE — Assessment & Plan Note (Signed)
Managed with 1/2 atorvastatin tablet (20 mg dose) .  Liver enzymes normal.  No changes today  Lab Results  Component Value Date   CHOL 190 09/22/2014   HDL 72.40 09/22/2014   LDLCALC 97 09/22/2014   LDLDIRECT 123.1 02/24/2013   TRIG 103.0 09/22/2014   CHOLHDL 3 09/22/2014   Lab Results  Component Value Date   ALT 10 03/21/2015   AST 19 03/21/2015   ALKPHOS 63 03/21/2015   BILITOT 0.6 03/21/2015

## 2015-03-29 NOTE — Assessment & Plan Note (Signed)
Managed with meloxicam.  No G I upset.  

## 2015-05-17 ENCOUNTER — Telehealth: Payer: Self-pay | Admitting: Internal Medicine

## 2015-05-17 NOTE — Telephone Encounter (Signed)
Patient called about her TDaP to see if it was up to date she was called back and told she had one on 08/25/2012.

## 2015-06-17 DIAGNOSIS — N281 Cyst of kidney, acquired: Secondary | ICD-10-CM | POA: Diagnosis not present

## 2015-06-17 DIAGNOSIS — Z1231 Encounter for screening mammogram for malignant neoplasm of breast: Secondary | ICD-10-CM | POA: Diagnosis not present

## 2015-06-20 LAB — HM MAMMOGRAPHY: HM Mammogram: NORMAL

## 2015-06-24 ENCOUNTER — Encounter: Payer: Self-pay | Admitting: Internal Medicine

## 2015-07-06 ENCOUNTER — Encounter: Payer: Self-pay | Admitting: Family Medicine

## 2015-07-06 ENCOUNTER — Ambulatory Visit (INDEPENDENT_AMBULATORY_CARE_PROVIDER_SITE_OTHER): Payer: Medicare Other | Admitting: Family Medicine

## 2015-07-06 DIAGNOSIS — J111 Influenza due to unidentified influenza virus with other respiratory manifestations: Secondary | ICD-10-CM | POA: Diagnosis not present

## 2015-07-06 MED ORDER — OSELTAMIVIR PHOSPHATE 75 MG PO CAPS: 75.0000 mg | ORAL_CAPSULE | Freq: Two times a day (BID) | ORAL | Status: DC

## 2015-07-06 NOTE — Patient Instructions (Signed)
Take the medication as prescribed.  Influenza, Adult Influenza ("the flu") is a viral infection of the respiratory tract. It occurs more often in winter months because people spend more time in close contact with one another. Influenza can make you feel very sick. Influenza easily spreads from person to person (contagious). CAUSES  Influenza is caused by a virus that infects the respiratory tract. You can catch the virus by breathing in droplets from an infected person's cough or sneeze. You can also catch the virus by touching something that was recently contaminated with the virus and then touching your mouth, nose, or eyes. RISKS AND COMPLICATIONS You may be at risk for a more severe case of influenza if you smoke cigarettes, have diabetes, have chronic heart disease (such as heart failure) or lung disease (such as asthma), or if you have a weakened immune system. Elderly people and pregnant women are also at risk for more serious infections. The most common problem of influenza is a lung infection (pneumonia). Sometimes, this problem can require emergency medical care and may be life threatening. SIGNS AND SYMPTOMS  Symptoms typically last 4 to 10 days and may include:  Fever.  Chills.  Headache, body aches, and muscle aches.  Sore throat.  Chest discomfort and cough.  Poor appetite.  Weakness or feeling tired.  Dizziness.  Nausea or vomiting. DIAGNOSIS  Diagnosis of influenza is often made based on your history and a physical exam. A nose or throat swab test can be done to confirm the diagnosis. TREATMENT  In mild cases, influenza goes away on its own. Treatment is directed at relieving symptoms. For more severe cases, your health care provider may prescribe antiviral medicines to shorten the sickness. Antibiotic medicines are not effective because the infection is caused by a virus, not by bacteria. HOME CARE INSTRUCTIONS  Take medicines only as directed by your health care  provider.  Use a cool mist humidifier to make breathing easier.  Get plenty of rest until your temperature returns to normal. This usually takes 3 to 4 days.  Drink enough fluid to keep your urine clear or pale yellow.  Cover yourmouth and nosewhen coughing or sneezing,and wash your handswellto prevent thevirusfrom spreading.  Stay homefromwork orschool untilthe fever is gonefor at least 42full day. PREVENTION  An annual influenza vaccination (flu shot) is the best way to avoid getting influenza. An annual flu shot is now routinely recommended for all adults in the [place] IF:  You experiencechest pain, yourcough worsens,or you producemore mucus.  Youhave nausea,vomiting, ordiarrhea.  Your fever returns or gets worse. SEEK IMMEDIATE MEDICAL CARE IF:  You havetrouble breathing, you become short of breath,or your skin ornails becomebluish.  You have severe painor stiffnessin the neck.  You develop a sudden headache, or pain in the face or ear.  You have nausea or vomiting that you cannot control. MAKE SURE YOU:   Understand these instructions.  Will watch your condition.  Will get help right away if you are not doing well or get worse.   This information is not intended to replace advice given to you by your health care provider. Make sure you discuss any questions you have with your health care provider.   Document Released: 05/11/2000 Document Revised: 06/04/2014 Document Reviewed: 08/13/2011 Elsevier Interactive Patient Education [employer].

## 2015-07-06 NOTE — Progress Notes (Signed)
   Subjective:  Patient ID: Kaitlyn Roman, female    DOB: August 25, 1945  Age: 70 y.o. MRN: [id number]  CC: Nasal congestion, fever, body aches  HPI:  70 year old female presents to clinic today for an acute visit with the above complaint.  Patient reports that for the past 1-2 weeks she's had nasal congestion. Yesterday she developed sudden onset fever. She reports associated body aches/myalgias. No associated cough. No known exacerbating or relieving factors. She has had a recent sick contact in her grandson. No other complaints at this time.  Social Hx   Social History   Social History  . Marital Status: Married    Spouse Name: N/A  . Number of Children: N/A  . Years of Education: N/A   Social History Main Topics  . Smoking status: Never Smoker   . Smokeless tobacco: Never Used  . Alcohol Use: Yes     Comment: one cocctail nightly   . Drug Use: No  . Sexual Activity: Not Asked   Other Topics Concern  . None   Social History Narrative   Review of Systems  Constitutional: Positive for fever.  HENT: Positive for congestion.   Musculoskeletal: Positive for myalgias.   Objective:  BP 132/88 mmHg  Pulse 87  Temp(Src) 101.6 F (38.7 C) (Oral)  Ht 5\' 2"  (1.575 m)  Wt 133 lb 2 oz (60.385 kg)  BMI 24.34 kg/m2  SpO2 98%  BP/Weight 07/06/2015 03/28/2015 [id number]  Systolic BP [id number]  Diastolic BP 88 86 88  Wt. (Lbs) 133.13 136.13 134.25  BMI 24.34 24.89 24.36   Physical Exam  Constitutional: She is oriented to person, place, and time. She appears well-developed. No distress.  HENT:  Head: Normocephalic and atraumatic.  Mouth/Throat: Oropharynx is clear and moist.  Normal TM's bilaterally. Swollen nasal turbinates with erythema.  Cardiovascular: Normal rate and regular rhythm.   2/6 systolic murmur.  Pulmonary/Chest: Effort normal.  Neurological: She is alert and oriented to person, place, and time.  Vitals reviewed.  Lab Results  Component Value Date   WBC  5.2 09/22/2014   HGB 13.4 09/22/2014   HCT 39.3 09/22/2014   PLT 252.0 09/22/2014   GLUCOSE 94 03/21/2015   CHOL 190 09/22/2014   TRIG 103.0 09/22/2014   HDL 72.40 09/22/2014   LDLDIRECT 123.1 02/24/2013   LDLCALC 97 09/22/2014   ALT 10 03/21/2015   AST 19 03/21/2015   NA 141 03/21/2015   K 4.8 03/21/2015   CL 103 03/21/2015   CREATININE 0.63 03/21/2015   BUN 25* 03/21/2015   CO2 29 03/21/2015   TSH 2.42 09/22/2014    Assessment & Plan:   Problem List Items Addressed This Visit    Influenza    New problem. History consistent with this and positive rapid flu today. Starting Tamiflu given the fact that her fever started within the last 48 hours.      Relevant Medications   oseltamivir (TAMIFLU) 75 MG capsule   Other Relevant Orders   POCT Influenza A/B      Meds ordered this encounter  Medications  . oseltamivir (TAMIFLU) 75 MG capsule    Sig: Take 1 capsule (75 mg total) by mouth 2 (two) times daily.    Dispense:  10 capsule    Refill:  0    Follow-up: PRN  [place]

## 2015-07-06 NOTE — Assessment & Plan Note (Signed)
New problem. History consistent with this and positive rapid flu today. Starting Tamiflu given the fact that her fever started within the last 48 hours.

## 2015-08-23 ENCOUNTER — Telehealth: Payer: Self-pay | Admitting: Internal Medicine

## 2015-08-23 NOTE — Telephone Encounter (Signed)
Pt dropped off Mammo screening results and kidney test results for Dr. Derrel Nip to review. Papers are in Dr. Lupita Dawn box.

## 2015-08-24 NOTE — Telephone Encounter (Signed)
In red folder. 

## 2015-10-25 DIAGNOSIS — Z9841 Cataract extraction status, right eye: Secondary | ICD-10-CM | POA: Diagnosis not present

## 2015-10-25 DIAGNOSIS — Z9842 Cataract extraction status, left eye: Secondary | ICD-10-CM | POA: Diagnosis not present

## 2015-10-25 DIAGNOSIS — H5203 Hypermetropia, bilateral: Secondary | ICD-10-CM | POA: Diagnosis not present

## 2015-12-20 ENCOUNTER — Encounter: Payer: Self-pay | Admitting: Internal Medicine

## 2015-12-20 ENCOUNTER — Ambulatory Visit (INDEPENDENT_AMBULATORY_CARE_PROVIDER_SITE_OTHER): Payer: Medicare Other | Admitting: Internal Medicine

## 2015-12-20 VITALS — BP 132/82 | HR 76 | Temp 98.3°F | Resp 16 | Wt 135.0 lb

## 2015-12-20 DIAGNOSIS — Z79899 Other long term (current) drug therapy: Secondary | ICD-10-CM

## 2015-12-20 DIAGNOSIS — E785 Hyperlipidemia, unspecified: Secondary | ICD-10-CM

## 2015-12-20 DIAGNOSIS — D692 Other nonthrombocytopenic purpura: Secondary | ICD-10-CM

## 2015-12-20 DIAGNOSIS — M8949 Other hypertrophic osteoarthropathy, multiple sites: Secondary | ICD-10-CM

## 2015-12-20 DIAGNOSIS — M159 Polyosteoarthritis, unspecified: Secondary | ICD-10-CM

## 2015-12-20 DIAGNOSIS — I10 Essential (primary) hypertension: Secondary | ICD-10-CM

## 2015-12-20 DIAGNOSIS — M150 Primary generalized (osteo)arthritis: Secondary | ICD-10-CM

## 2015-12-20 LAB — CBC WITH DIFFERENTIAL/PLATELET
Basophils Absolute: 0 10*3/uL (ref 0.0–0.1)
Basophils Relative: 0.6 % (ref 0.0–3.0)
Eosinophils Absolute: 0 10*3/uL (ref 0.0–0.7)
Eosinophils Relative: 0.6 % (ref 0.0–5.0)
HCT: 37.2 % (ref 36.0–46.0)
Hemoglobin: 12.7 g/dL (ref 12.0–15.0)
Lymphocytes Relative: 36.1 % (ref 12.0–46.0)
Lymphs Abs: 1.8 10*3/uL (ref 0.7–4.0)
MCHC: 34.1 g/dL (ref 30.0–36.0)
MCV: 94.7 fl (ref 78.0–100.0)
Monocytes Absolute: 0.3 10*3/uL (ref 0.1–1.0)
Monocytes Relative: 6.7 % (ref 3.0–12.0)
Neutro Abs: 2.7 10*3/uL (ref 1.4–7.7)
Neutrophils Relative %: 56 % (ref 43.0–77.0)
Platelets: 257 10*3/uL (ref 150.0–400.0)
RBC: 3.93 Mil/uL (ref 3.87–5.11)
RDW: 13.3 % (ref 11.5–15.5)
WBC: 4.9 10*3/uL (ref 4.0–10.5)

## 2015-12-20 LAB — COMPREHENSIVE METABOLIC PANEL
ALT: 10 U/L (ref 0–35)
AST: 17 U/L (ref 0–37)
Albumin: 4.3 g/dL (ref 3.5–5.2)
Alkaline Phosphatase: 56 U/L (ref 39–117)
BUN: 27 mg/dL — ABNORMAL HIGH (ref 6–23)
CO2: 29 mEq/L (ref 19–32)
Calcium: 9.7 mg/dL (ref 8.4–10.5)
Chloride: 107 mEq/L (ref 96–112)
Creatinine, Ser: 0.82 mg/dL (ref 0.40–1.20)
GFR: 72.75 mL/min (ref >60.00)
Glucose, Bld: 138 mg/dL — ABNORMAL HIGH (ref 70–99)
Potassium: 4.2 mEq/L (ref 3.5–5.1)
Sodium: 143 mEq/L (ref 135–145)
Total Bilirubin: 0.4 mg/dL (ref 0.2–1.2)
Total Protein: 6.9 g/dL (ref 6.0–8.3)

## 2015-12-20 LAB — TSH: TSH: 2.09 u[IU]/mL (ref 0.35–4.50)

## 2015-12-20 LAB — LDL CHOLESTEROL, DIRECT: Direct LDL: 104 mg/dL

## 2015-12-20 MED ORDER — LOSARTAN POTASSIUM 50 MG PO TABS: ORAL_TABLET | ORAL | 0 refills | Status: DC

## 2015-12-20 NOTE — Progress Notes (Signed)
Subjective:  Patient ID: Kaitlyn Roman, female    DOB: 22-Oct-1945  Age: 70 y.o. MRN: [id number]  CC: The primary encounter diagnosis was Purpura ([place]). Diagnoses of Hyperlipidemia, Long-term use of high-risk medication, Essential hypertension, and Primary osteoarthritis involving multiple joints were also pertinent to this visit.  HPI Kaitlyn Roman presents for follow up on hypertension, hyperlipidemia,, and OA. Patient is taking her medications as prescribed and notes no adverse effects.  Home BP readings have been done about once per week and are  Generally equal to or  > 130/80 .  She is avoiding added salt in her diet and walking regularly about 3 times per week for exercise  .  Outpatient Medications Prior to Visit  Medication Sig Dispense Refill  . aspirin 81 MG tablet Take 81 mg by mouth daily.    [name] atorvastatin (LIPITOR) 40 MG tablet TAKE 1 TABLET EVERY DAY 90 tablet 1  . doxylamine, Sleep, (UNISOM) 25 MG tablet Take 12.5 mg by mouth at bedtime as needed for sleep.     . meloxicam (MOBIC) 15 MG tablet Take 1 tablet (15 mg total) by mouth daily. 90 tablet 1  . Probiotic Product (PROBIOTIC PO) Take 1 capsule by mouth daily.    [name] losartan (COZAAR) 50 MG tablet TAKE 1 TABLET EVERY DAY 90 tablet 1  . Cholecalciferol (VITAMIN D-3) 1000 UNITS CAPS Take 1 capsule by mouth daily.    . cyanocobalamin 1000 MCG tablet Take 1,000 mcg by mouth daily.    [name] estradiol (ESTRACE) 0.5 MG tablet Take 0.5 tablets (0.25 mg total) by mouth daily. 90 tablet 1  . fluticasone (FLONASE) 50 MCG/ACT nasal spray Place 2 sprays into both nostrils daily. (Patient not taking: Reported on 12/20/2015) 16 g 6  . Omega-3 Fatty Acids (FISH OIL) 1000 MG CAPS Take 1 capsule by mouth daily. Reported on 07/06/2015    . dextromethorphan-guaiFENesin (MUCINEX DM) 30-600 MG per 12 hr tablet Take 1 tablet by mouth 2 (two) times daily as needed for cough.    [name] oseltamivir (TAMIFLU) 75 MG capsule Take 1 capsule (75 mg total) by mouth  2 (two) times daily. 10 capsule 0  . phenylephrine (SUDAFED PE) 10 MG TABS tablet Take 10 mg by mouth every 4 (four) hours as needed.     No facility-administered medications prior to visit.     Review of Systems;  Patient denies headache, fevers, malaise, unintentional weight loss, skin rash, eye pain, sinus congestion and sinus pain, sore throat, dysphagia,  hemoptysis , cough, dyspnea, wheezing, chest pain, palpitations, orthopnea, edema, abdominal pain, nausea, melena, diarrhea, constipation, flank pain, dysuria, hematuria, urinary  Frequency, nocturia, numbness, tingling, seizures,  Focal weakness, Loss of consciousness,  Tremor, insomnia, depression, anxiety, and suicidal ideation.      Objective:  BP 132/82 (BP Location: Right Arm, Patient Position: Sitting, Cuff Size: Normal)   Pulse 76   Temp 98.3 F (36.8 C) (Oral)   Resp 16   Wt 135 lb (61.2 kg)   BMI 24.69 kg/m   BP Readings from Last 3 Encounters:  12/20/15 132/82  07/06/15 132/88  03/28/15 (!) 144/86    Wt Readings from Last 3 Encounters:  12/20/15 135 lb (61.2 kg)  07/06/15 133 lb 2 oz (60.4 kg)  03/28/15 136 lb 2 oz (61.7 kg)    General appearance: alert, cooperative and appears stated age Ears: normal TM's and external ear canals both ears Throat: lips, mucosa, and tongue normal; teeth and gums normal Neck: no adenopathy,  no carotid bruit, supple, symmetrical, trachea midline and thyroid not enlarged, symmetric, no tenderness/mass/nodules Back: symmetric, no curvature. ROM normal. No CVA tenderness. Lungs: clear to auscultation bilaterally Heart: regular rate and rhythm, S1, S2 normal, no murmur, click, rub or gallop Abdomen: soft, non-tender; bowel sounds normal; no masses,  no organomegaly Pulses: 2+ and symmetric Skin: Skin color, texture, turgor normal. No rashes or lesions Lymph nodes: Cervical, supraclavicular, and axillary nodes normal.  No results found for: HGBA1C  Lab Results  Component  Value Date   CREATININE 0.82 12/20/2015   CREATININE 0.63 03/21/2015   CREATININE 0.68 09/22/2014    Lab Results  Component Value Date   WBC 4.9 12/20/2015   HGB 12.7 12/20/2015   HCT 37.2 12/20/2015   PLT 257.0 12/20/2015   GLUCOSE 138 (H) 12/20/2015   CHOL 190 09/22/2014   TRIG 103.0 09/22/2014   HDL 72.40 09/22/2014   LDLDIRECT 104.0 12/20/2015   LDLCALC 97 09/22/2014   ALT 10 12/20/2015   AST 17 12/20/2015   NA 143 12/20/2015   K 4.2 12/20/2015   CL 107 12/20/2015   CREATININE 0.82 12/20/2015   BUN 27 (H) 12/20/2015   CO2 29 12/20/2015   TSH 2.09 12/20/2015    No results found.  Assessment & Plan:   Problem List Items Addressed This Visit    Hyperlipidemia    Managed with 1/2 atorvastatin tablet (20 mg dose) daily .  Liver enzymes normal.  No changes today  Lab Results  Component Value Date   CHOL 190 09/22/2014   HDL 72.40 09/22/2014   LDLCALC 97 09/22/2014   LDLDIRECT 104.0 12/20/2015   TRIG 103.0 09/22/2014   CHOLHDL 3 09/22/2014   Lab Results  Component Value Date   ALT 10 12/20/2015   AST 17 12/20/2015   ALKPHOS 56 12/20/2015   BILITOT 0.4 12/20/2015         Relevant Medications   losartan (COZAAR) 50 MG tablet   Other Relevant Orders   LDL cholesterol, direct (Completed)   TSH (Completed)   Hypertension    She has a history of hypertension ban dhas had several elevated readings.  She has been asked to check her pressures at home and submit readings for evaluation. Renal function  And lytes are normal  Lab Results  Component Value Date   CREATININE 0.82 12/20/2015   Lab Results  Component Value Date   NA 143 12/20/2015   K 4.2 12/20/2015   CL 107 12/20/2015   CO2 29 12/20/2015         Relevant Medications   losartan (COZAAR) 50 MG tablet   OA (osteoarthritis)    Managed with meloxicam.  No G I upset.       Purpura ([place]) - Primary    Secondary to aspirin use.  CBC normal.  Lab Results  Component Value Date   WBC 4.9  12/20/2015   HGB 12.7 12/20/2015   HCT 37.2 12/20/2015   MCV 94.7 12/20/2015   PLT 257.0 12/20/2015        Relevant Orders   CBC with Differential/Platelet (Completed)    Other Visit Diagnoses    Long-term use of high-risk medication       Relevant Orders   Comprehensive metabolic panel (Completed)      I have discontinued [name]'s phenylephrine, dextromethorphan-guaiFENesin, and oseltamivir. I am also having her maintain her aspirin, doxylamine (Sleep), Fish Oil, Vitamin D-3, meloxicam, estradiol, atorvastatin, fluticasone, cyanocobalamin, Probiotic Product (PROBIOTIC PO), and losartan.  Meds  ordered this encounter  Medications  . losartan (COZAAR) 50 MG tablet    Sig: TAKE 1 TABLET EVERY DAY    Dispense:  30 tablet    Refill:  0    Medications Discontinued During This Encounter  Medication Reason  . dextromethorphan-guaiFENesin (MUCINEX DM) 30-600 MG per 12 hr tablet No longer needed (for PRN medications)  . oseltamivir (TAMIFLU) 75 MG capsule Completed Course  . phenylephrine (SUDAFED PE) 10 MG TABS tablet No longer needed (for PRN medications)  . losartan (COZAAR) 50 MG tablet Reorder    Follow-up: Return in about 6 months (around 06/21/2016), or medicare wellness .   Crecencio Mc, MD

## 2015-12-20 NOTE — Patient Instructions (Addendum)
Your Blood pressure is borderline.   Ourgoal is < 130/80  Please Check BP 4 times over the next month and drop off your readings to the office so I can review.   We may add hctz toyour  losartan for improved control if needed   Return for wellness visit in 6 months (January)

## 2015-12-21 DIAGNOSIS — D692 Other nonthrombocytopenic purpura: Secondary | ICD-10-CM | POA: Insufficient documentation

## 2015-12-21 NOTE — Assessment & Plan Note (Signed)
She has a history of hypertension ban dhas had several elevated readings.  She has been asked to check her pressures at home and submit readings for evaluation. Renal function  And lytes are normal  Lab Results  Component Value Date   CREATININE 0.82 12/20/2015   Lab Results  Component Value Date   NA 143 12/20/2015   K 4.2 12/20/2015   CL 107 12/20/2015   CO2 29 12/20/2015

## 2015-12-21 NOTE — Assessment & Plan Note (Signed)
Managed with meloxicam.  No G I upset.  

## 2015-12-21 NOTE — Assessment & Plan Note (Signed)
Secondary to aspirin use.  CBC normal.  Lab Results  Component Value Date   WBC 4.9 12/20/2015   HGB 12.7 12/20/2015   HCT 37.2 12/20/2015   MCV 94.7 12/20/2015   PLT 257.0 12/20/2015

## 2015-12-21 NOTE — Assessment & Plan Note (Signed)
Managed with 1/2 atorvastatin tablet (20 mg dose) daily .  Liver enzymes normal.  No changes today  Lab Results  Component Value Date   CHOL 190 09/22/2014   HDL 72.40 09/22/2014   LDLCALC 97 09/22/2014   LDLDIRECT 104.0 12/20/2015   TRIG 103.0 09/22/2014   CHOLHDL 3 09/22/2014   Lab Results  Component Value Date   ALT 10 12/20/2015   AST 17 12/20/2015   ALKPHOS 56 12/20/2015   BILITOT 0.4 12/20/2015

## 2016-01-23 ENCOUNTER — Telehealth: Payer: Self-pay | Admitting: Internal Medicine

## 2016-01-23 NOTE — Telephone Encounter (Signed)
Pt dropped off her blood pressure results. Dr. Derrel Nip wanted her to monitor for a few weeks. Results are in Dr. Lupita Dawn folder up front.

## 2016-01-23 NOTE — Telephone Encounter (Signed)
Placed in yellow results folder.  °

## 2016-01-24 ENCOUNTER — Other Ambulatory Visit: Payer: Self-pay | Admitting: Internal Medicine

## 2016-01-24 MED ORDER — HYDROCHLOROTHIAZIDE 12.5 MG PO CAPS: 12.5000 mg | ORAL_CAPSULE | Freq: Every day | ORAL | 0 refills | Status: DC

## 2016-01-24 NOTE — Telephone Encounter (Signed)
BPS are borderline.  If she has not started the losartan 50 mg that was prescribed in July, I would start at a lower dose,  25 mg and will send it to pharmacy

## 2016-01-24 NOTE — Telephone Encounter (Signed)
Pt is taking 50 mg of losartan do you want to cut dosage or increase?

## 2016-02-07 ENCOUNTER — Ambulatory Visit: Payer: Medicare Other

## 2016-04-13 ENCOUNTER — Telehealth: Payer: Self-pay | Admitting: Internal Medicine

## 2016-04-13 MED ORDER — LOSARTAN POTASSIUM 50 MG PO TABS: ORAL_TABLET | ORAL | 1 refills | Status: DC

## 2016-04-13 NOTE — Telephone Encounter (Signed)
Pt called requesting a refill on losartan (COZAAR) 50 MG tablet and would like a 90 day supply.  [place], [place]  Call pt @ [phone]

## 2016-04-13 NOTE — Telephone Encounter (Signed)
Refill sent.

## 2016-04-25 ENCOUNTER — Telehealth: Payer: Self-pay | Admitting: Internal Medicine

## 2016-04-25 NOTE — Telephone Encounter (Signed)
Pt called and stated that she only has 10 days left for her hydrochlorothiazide (MICROZIDE) 12.5 MG capsule and it has no refills. Does she need to continue taking it? If so she would need it refilled. Could you please call to let her know either way. Please advise, thank you!  [place], [place]  Call pt @ [phone]

## 2016-04-26 MED ORDER — LOSARTAN POTASSIUM-HCTZ 50-12.5 MG PO TABS: 1.0000 | ORAL_TABLET | Freq: Every day | ORAL | 3 refills | Status: DC

## 2016-04-26 NOTE — Telephone Encounter (Signed)
Is patient to continue the HCTZ the note from August stated BP borderline changing losartan to 25 mg patient taking 50 mg and HCTZ 12.5 mg.

## 2016-04-26 NOTE — Telephone Encounter (Signed)
She should start taking Losartan/ht 50/12.5  One tablet daily  , sent to [facility].  I dc'd  the individual ones

## 2016-04-26 NOTE — Telephone Encounter (Signed)
Patient notified

## 2016-06-06 ENCOUNTER — Ambulatory Visit (INDEPENDENT_AMBULATORY_CARE_PROVIDER_SITE_OTHER): Payer: Medicare Other

## 2016-06-06 VITALS — BP 130/78 | HR 71 | Temp 98.0°F | Resp 12 | Ht 61.5 in | Wt 130.8 lb

## 2016-06-06 DIAGNOSIS — Z0000 Encounter for general adult medical examination without abnormal findings: Secondary | ICD-10-CM

## 2016-06-06 NOTE — Progress Notes (Signed)
Subjective:   Kaitlyn Roman is a 71 y.o. female who presents for Medicare Annual (Subsequent) preventive examination.  Review of Systems:  No ROS.  Medicare Wellness Visit.  Cardiac Risk Factors include: advanced age (>41men, >34 women);hypertension     Objective:     Vitals: BP 130/78 (BP Location: Right Arm, Patient Position: Sitting, Cuff Size: Normal)   Pulse 71   Temp 98 F (36.7 C) (Oral)   Resp 12   Ht 5' 1.5" (1.562 m)   Wt 130 lb 12.8 oz (59.3 kg)   SpO2 96%   BMI 24.31 kg/m   Body mass index is 24.31 kg/m.   Tobacco History  Smoking Status  . Never Smoker  Smokeless Tobacco  . Never Used     Counseling given: Not Answered   Past Medical History:  Diagnosis Date  . Colitis, infectious April 2013    C dificile colitis, hosp [facility] April 2013 x 2 weeks  . Hyperlipidemia   . Hypertension   . Polymyalgia rheumatica syndrome [facility])    rheumatoloigst Dr Zenia [facility]ides at [facility]   Past Surgical History:  Procedure Laterality Date  . ABDOMINAL HYSTERECTOMY    . CRANIECTOMY FOR EXCISION OF ACOUSTIC NEUROMA     Family History  Problem Relation Age of Onset  . Heart disease Father   . Stroke Father   . Parkinson's disease Father   . Dementia Mother   . Aortic aneurysm Mother   . Cancer Neg Hx    History  Sexual Activity  . Sexual activity: Yes    Outpatient Encounter Prescriptions as of 06/06/2016  Medication Sig  . aspirin 81 MG tablet Take 81 mg by mouth daily.  [name] atorvastatin (LIPITOR) 40 MG tablet TAKE 1 TABLET EVERY DAY  . losartan-hydrochlorothiazide (HYZAAR) 50-12.5 MG tablet Take 1 tablet by mouth daily.  . meloxicam (MOBIC) 15 MG tablet Take 1 tablet (15 mg total) by mouth daily.  . Multiple Vitamins-Minerals (HAIR/SKIN/NAILS/BIOTIN PO) Take 1 tablet by mouth.  . Probiotic Product (PROBIOTIC PO) Take 1 capsule by mouth daily.  . [DISCONTINUED] Omega-3 Fatty Acids (FISH OIL) 1000 MG CAPS Take 1 capsule by mouth daily. Reported on 07/06/2015   . Cholecalciferol (VITAMIN D-3) 1000 UNITS CAPS Take 1 capsule by mouth daily.  [name] doxylamine, Sleep, (UNISOM) 25 MG tablet Take 12.5 mg by mouth at bedtime as needed for sleep.   . fluticasone (FLONASE) 50 MCG/ACT nasal spray Place 2 sprays into both nostrils daily. (Patient not taking: Reported on 06/06/2016)  . [DISCONTINUED] cyanocobalamin 1000 MCG tablet Take 1,000 mcg by mouth daily.  . [DISCONTINUED] estradiol (ESTRACE) 0.5 MG tablet Take 0.5 tablets (0.25 mg total) by mouth daily.   No facility-administered encounter medications on file as of 06/06/2016.     Activities of Daily Living In your present state of health, do you have any difficulty performing the following activities: 06/06/2016  Hearing? Y  Vision? N  Difficulty concentrating or making decisions? N  Walking or climbing stairs? N  Dressing or bathing? N  Doing errands, shopping? Y  Preparing Food and eating ? N  Using the Toilet? N  In the past six months, have you accidently leaked urine? N  Do you have problems with loss of bowel control? N  Managing your Medications? N  Managing your Finances? N  Housekeeping or managing your Housekeeping? N  Some recent data might be hidden    Patient Care Team: Crecencio Mc, MD as PCP - General (Internal Medicine)  Assessment:    This is a routine wellness examination for Kaitlyn Roman. The goal of the wellness visit is to assist the patient how to close the gaps in care and create a preventative care plan for the patient.   Currently not taking calcium VIT D/Osteoporosis risk reviewed.  Medications reviewed; taking without issues or barriers.  Safety issues reviewed; smoke detectors in the home. No firearms in the home. Wears seatbelts when driving or riding with others. No violence in the home.  No identified risk were noted; The patient was oriented x 3; appropriate in dress and manner and no objective failures at ADL's or IADL's.   BMI; discussed the importance  of a healthy diet, water intake and exercise. Educational material provided.  HTN; followed by PCP.  Hepatitis C screening; discussed.  Educational material provided.  Mammogram scheduled this month.  Patient Concerns: None at this time. Follow up with PCP as needed. Exercise Activities and Dietary recommendations  Current Exercise Habits: Structured exercise class, Type of exercise: walking;calisthenics;strength training/weights;stretching;treadmill, Time (Minutes): 45, Frequency (Times/Week): 5, Weekly Exercise (Minutes/Week): 225, Intensity: Moderate  Goals    . Increase water intake          Stay hydrated and drink plenty of fluids/water      Fall Risk Fall Risk  06/06/2016 09/22/2014 02/24/2013  Falls in the past year? No No No   Depression Screen PHQ 2/9 Scores 06/06/2016 09/22/2014 02/24/2013  PHQ - 2 Score 0 0 0     Cognitive Function     6CIT Screen 06/06/2016  What Year? 0 points  What month? 0 points  What time? 0 points  Count back from 20 0 points  Months in reverse 0 points  Repeat phrase 0 points  Total Score 0    Immunization History  Administered Date(s) Administered  . Influenza Split 03/14/2011, 03/24/2012  . Influenza, High Dose Seasonal PF 03/28/2015  . Influenza,inj,Quad PF,36+ Mos 02/24/2013, 03/02/2014  . Pneumococcal Conjugate-13 09/22/2014  . Pneumococcal Polysaccharide-23 03/14/2011  . Tdap 08/25/2012  . Zoster 08/20/2010   Screening Tests Health Maintenance  Topic Date Due  . Hepatitis C Screening  March 17, 1946  . INFLUENZA VACCINE  08/25/2016 (Originally 12/27/2015)  . MAMMOGRAM  06/19/2017  . COLONOSCOPY  05/06/2022  . TETANUS/TDAP  08/26/2022  . DEXA SCAN  Completed  . ZOSTAVAX  Completed  . PNA vac Low Risk Adult  Completed      Plan:    End of life planning; Advance aging; Advanced directives discussed. Copy of current HCPOA/Living Will requested.  Medicare Attestation I have personally reviewed: The patient's medical and  social history Their use of alcohol, tobacco or illicit drugs Their current medications and supplements The patient's functional ability including ADLs,fall risks, home safety risks, cognitive, and hearing and visual impairment Diet and physical activities Evidence for depression   The patient's weight, height, BMI, and visual acuity have been recorded in the chart.  I have made referrals and provided education to the patient based on review of the above and I have provided the patient with a written personalized care plan for preventive services.    During the course of the visit the patient was educated and counseled about the following appropriate screening and preventive services:   Vaccines to include Pneumoccal, Influenza, Hepatitis B, Td, Zostavax, HCV  Electrocardiogram  Cardiovascular Disease  Colorectal cancer screening  Bone density screening  Diabetes screening  Glaucoma screening  Mammography/PAP  Nutrition counseling   Patient Instructions (the written plan) was  given to the patient.   Varney Biles, LPN  [id number]

## 2016-06-06 NOTE — Patient Instructions (Addendum)
Kaitlyn Roman , Thank you for taking time to come for your Medicare Wellness Visit. I appreciate your ongoing commitment to your health goals. Please review the following plan we discussed and let me know if I can assist you in the future.   6 month follow up with Dr. Derrel Nip.  Sodium range 135-145; your last result 143   These are the goals we discussed: Goals    . Increase water intake          Stay hydrated and drink plenty of fluids/water       This is a list of the screening recommended for you and due dates:  Health Maintenance  Topic Date Due  .  Hepatitis C: One time screening is recommended by Center for Disease Control  (CDC) for  adults born from [birth year] through 1967.   [birth year]  . Flu Shot  08/25/2016*  . Mammogram  06/19/2017  . Colon Cancer Screening  05/06/2022  . Tetanus Vaccine  08/26/2022  . DEXA scan (bone density measurement)  Completed  . Shingles Vaccine  Completed  . Pneumonia vaccines  Completed  *Topic was postponed. The date shown is not the original due date.    Fall Prevention in the Home Introduction Falls can cause injuries. They can happen to people of all ages. There are many things you can do to make your home safe and to help prevent falls. What can I do on the outside of my home?  Regularly fix the edges of walkways and driveways and fix any cracks.  Remove anything that might make you trip as you walk through a door, such as a raised step or threshold.  Trim any bushes or trees on the path to your home.  Use bright outdoor lighting.  Clear any walking paths of anything that might make someone trip, such as rocks or tools.  Regularly check to see if handrails are loose or broken. Make sure that both sides of any steps have handrails.  Any raised decks and porches should have guardrails on the edges.  Have any leaves, snow, or ice cleared regularly.  Use sand or salt on walking paths during winter.  Clean up any spills in your  garage right away. This includes oil or grease spills. What can I do in the bathroom?  Use night lights.  Install grab bars by the toilet and in the tub and shower. Do not use towel bars as grab bars.  Use non-skid mats or decals in the tub or shower.  If you need to sit down in the shower, use a plastic, non-slip stool.  Keep the floor dry. Clean up any water that spills on the floor as soon as it happens.  Remove soap buildup in the tub or shower regularly.  Attach bath mats securely with double-sided non-slip rug tape.  Do not have throw rugs and other things on the floor that can make you trip. What can I do in the bedroom?  Use night lights.  Make sure that you have a light by your bed that is easy to reach.  Do not use any sheets or blankets that are too big for your bed. They should not hang down onto the floor.  Have a firm chair that has side arms. You can use this for support while you get dressed.  Do not have throw rugs and other things on the floor that can make you trip. What can I do in the kitchen?  Clean up any  spills right away.  Avoid walking on wet floors.  Keep items that you use a lot in easy-to-reach places.  If you need to reach something above you, use a strong step stool that has a grab bar.  Keep electrical cords out of the way.  Do not use floor polish or wax that makes floors slippery. If you must use wax, use non-skid floor wax.  Do not have throw rugs and other things on the floor that can make you trip. What can I do with my stairs?  Do not leave any items on the stairs.  Make sure that there are handrails on both sides of the stairs and use them. Fix handrails that are broken or loose. Make sure that handrails are as long as the stairways.  Check any carpeting to make sure that it is firmly attached to the stairs. Fix any carpet that is loose or worn.  Avoid having throw rugs at the top or bottom of the stairs. If you do have throw  rugs, attach them to the floor with carpet tape.  Make sure that you have a light switch at the top of the stairs and the bottom of the stairs. If you do not have them, ask someone to add them for you. What else can I do to help prevent falls?  Wear shoes that:  Do not have high heels.  Have rubber bottoms.  Are comfortable and fit you well.  Are closed at the toe. Do not wear sandals.  If you use a stepladder:  Make sure that it is fully opened. Do not climb a closed stepladder.  Make sure that both sides of the stepladder are locked into place.  Ask someone to hold it for you, if possible.  Clearly mark and make sure that you can see:  Any grab bars or handrails.  First and last steps.  Where the edge of each step is.  Use tools that help you move around (mobility aids) if they are needed. These include:  Canes.  Walkers.  Scooters.  Crutches.  Turn on the lights when you go into a dark area. Replace any light bulbs as soon as they burn out.  Set up your furniture so you have a clear path. Avoid moving your furniture around.  If any of your floors are uneven, fix them.  If there are any pets around you, be aware of where they are.  Review your medicines with your doctor. Some medicines can make you feel dizzy. This can increase your chance of falling. Ask your doctor what other things that you can do to help prevent falls. This information is not intended to replace advice given to you by your health care provider. Make sure you discuss any questions you have with your health care provider. Document Released: 03/10/2009 Document Revised: 10/20/2015 Document Reviewed: 06/18/2014  2017 Elsevier

## 2016-06-08 ENCOUNTER — Telehealth: Payer: Self-pay | Admitting: *Deleted

## 2016-06-08 ENCOUNTER — Other Ambulatory Visit: Payer: Self-pay

## 2016-06-08 DIAGNOSIS — Z79899 Other long term (current) drug therapy: Secondary | ICD-10-CM

## 2016-06-08 MED ORDER — ATORVASTATIN CALCIUM 40 MG PO TABS: ORAL_TABLET | ORAL | 0 refills | Status: DC

## 2016-06-08 MED ORDER — LOSARTAN POTASSIUM-HCTZ 50-12.5 MG PO TABS: 1.0000 | ORAL_TABLET | Freq: Every day | ORAL | 0 refills | Status: DC

## 2016-06-08 NOTE — Telephone Encounter (Signed)
Patient will change her pharmacy to [facility] on [facility]  Patient requested to have a refill for atorvastatin, losartan and meloxicam Please call pt when refills have been sent [phone]

## 2016-06-08 NOTE — Telephone Encounter (Signed)
Atorvastatin and Losartan sent to [facility]. Meloxicam waiting for provider approval

## 2016-06-08 NOTE — Telephone Encounter (Signed)
Refilled Losartan and Lipitor for # 90 and 0 refills Refill request for Meloxicam 15 mg Last refilled 02/22/2014 Last OV 07/06/2015 Next appt 08/23/15 Please advise

## 2016-06-10 NOTE — Progress Notes (Signed)
  I have reviewed the above information and agree with above.   Teresa Tullo, MD 

## 2016-06-11 ENCOUNTER — Telehealth: Payer: Self-pay

## 2016-06-11 NOTE — Telephone Encounter (Signed)
Could we add to Lab schedule 06/12/16 for CMET and LDL labs.Day is booked and will need to be added. Patient has appt for a RN visit that day and will be willing to fast to get both done on the same day. Please advise

## 2016-06-11 NOTE — Telephone Encounter (Signed)
Lab appt set up for 06/12/2016

## 2016-06-11 NOTE — Addendum Note (Signed)
Addended by: Crecencio Mc on: 06/11/2016 08:53 AM   Modules accepted: Orders

## 2016-06-11 NOTE — Telephone Encounter (Signed)
Patient needs non fasting labs prior to refills,  please call her and arrange

## 2016-06-11 NOTE — Telephone Encounter (Signed)
I have scheduled the patient for 10:00 on 1.16.17 .

## 2016-06-11 NOTE — Telephone Encounter (Signed)
Patient notified and will be in tomorrow for her fasting labs and her flu shot afterwards

## 2016-06-12 ENCOUNTER — Other Ambulatory Visit (INDEPENDENT_AMBULATORY_CARE_PROVIDER_SITE_OTHER): Payer: Medicare Other

## 2016-06-12 ENCOUNTER — Ambulatory Visit (INDEPENDENT_AMBULATORY_CARE_PROVIDER_SITE_OTHER): Payer: Medicare Other

## 2016-06-12 DIAGNOSIS — Z23 Encounter for immunization: Secondary | ICD-10-CM

## 2016-06-12 DIAGNOSIS — Z79899 Other long term (current) drug therapy: Secondary | ICD-10-CM

## 2016-06-12 LAB — COMPREHENSIVE METABOLIC PANEL
ALT: 13 U/L (ref 0–35)
AST: 21 U/L (ref 0–37)
Albumin: 4.3 g/dL (ref 3.5–5.2)
Alkaline Phosphatase: 56 U/L (ref 39–117)
BUN: 22 mg/dL (ref 6–23)
CO2: 30 mEq/L (ref 19–32)
Calcium: 9.7 mg/dL (ref 8.4–10.5)
Chloride: 101 mEq/L (ref 96–112)
Creatinine, Ser: 0.8 mg/dL (ref 0.40–1.20)
GFR: 74.76 mL/min (ref >60.00)
Glucose, Bld: 100 mg/dL — ABNORMAL HIGH (ref 70–99)
Potassium: 4.2 mEq/L (ref 3.5–5.1)
Sodium: 139 mEq/L (ref 135–145)
Total Bilirubin: 0.5 mg/dL (ref 0.2–1.2)
Total Protein: 7.2 g/dL (ref 6.0–8.3)

## 2016-06-12 LAB — LDL CHOLESTEROL, DIRECT: Direct LDL: 125 mg/dL

## 2016-06-13 MED ORDER — ATORVASTATIN CALCIUM 40 MG PO TABS: ORAL_TABLET | ORAL | 0 refills | Status: DC

## 2016-06-13 MED ORDER — LOSARTAN POTASSIUM-HCTZ 50-12.5 MG PO TABS: 1.0000 | ORAL_TABLET | Freq: Every day | ORAL | 0 refills | Status: DC

## 2016-06-13 NOTE — Addendum Note (Signed)
Addended by: Crecencio Mc on: 06/13/2016 04:22 PM   Modules accepted: Orders

## 2016-06-13 NOTE — Telephone Encounter (Signed)
Refilled. Message sent

## 2016-06-17 ENCOUNTER — Encounter: Payer: Self-pay | Admitting: Internal Medicine

## 2016-06-18 ENCOUNTER — Encounter: Payer: Self-pay | Admitting: Internal Medicine

## 2016-06-19 DIAGNOSIS — Z1231 Encounter for screening mammogram for malignant neoplasm of breast: Secondary | ICD-10-CM | POA: Diagnosis not present

## 2016-06-19 LAB — HM MAMMOGRAPHY

## 2016-06-21 ENCOUNTER — Encounter: Payer: Self-pay | Admitting: Internal Medicine

## 2016-07-11 ENCOUNTER — Ambulatory Visit: Payer: Medicare Other | Admitting: Internal Medicine

## 2016-08-17 ENCOUNTER — Ambulatory Visit (INDEPENDENT_AMBULATORY_CARE_PROVIDER_SITE_OTHER): Payer: Medicare Other | Admitting: Internal Medicine

## 2016-08-17 ENCOUNTER — Encounter: Payer: Self-pay | Admitting: Internal Medicine

## 2016-08-17 VITALS — BP 132/88 | HR 70 | Temp 98.3°F | Resp 16 | Ht 61.25 in | Wt 131.6 lb

## 2016-08-17 DIAGNOSIS — R5383 Other fatigue: Secondary | ICD-10-CM | POA: Diagnosis not present

## 2016-08-17 DIAGNOSIS — I10 Essential (primary) hypertension: Secondary | ICD-10-CM | POA: Diagnosis not present

## 2016-08-17 DIAGNOSIS — Z23 Encounter for immunization: Secondary | ICD-10-CM | POA: Diagnosis not present

## 2016-08-17 DIAGNOSIS — E7800 Pure hypercholesterolemia, unspecified: Secondary | ICD-10-CM | POA: Diagnosis not present

## 2016-08-17 LAB — CBC WITH DIFFERENTIAL/PLATELET
Basophils Absolute: 0.1 10*3/uL (ref 0.0–0.1)
Basophils Relative: 1.1 % (ref 0.0–3.0)
Eosinophils Absolute: 0 10*3/uL (ref 0.0–0.7)
Eosinophils Relative: 1 % (ref 0.0–5.0)
HCT: 41.9 % (ref 36.0–46.0)
Hemoglobin: 14.1 g/dL (ref 12.0–15.0)
Lymphocytes Relative: 38.9 % (ref 12.0–46.0)
Lymphs Abs: 1.8 10*3/uL (ref 0.7–4.0)
MCHC: 33.7 g/dL (ref 30.0–36.0)
MCV: 96.9 fl (ref 78.0–100.0)
Monocytes Absolute: 0.4 10*3/uL (ref 0.1–1.0)
Monocytes Relative: 8.3 % (ref 3.0–12.0)
Neutro Abs: 2.3 10*3/uL (ref 1.4–7.7)
Neutrophils Relative %: 50.7 % (ref 43.0–77.0)
Platelets: 295 10*3/uL (ref 150.0–400.0)
RBC: 4.32 Mil/uL (ref 3.87–5.11)
RDW: 13.7 % (ref 11.5–15.5)
WBC: 4.6 10*3/uL (ref 4.0–10.5)

## 2016-08-17 LAB — COMPREHENSIVE METABOLIC PANEL
ALT: 11 U/L (ref 0–35)
AST: 19 U/L (ref 0–37)
Albumin: 4.6 g/dL (ref 3.5–5.2)
Alkaline Phosphatase: 66 U/L (ref 39–117)
BUN: 18 mg/dL (ref 6–23)
CO2: 29 mEq/L (ref 19–32)
Calcium: 9.9 mg/dL (ref 8.4–10.5)
Chloride: 102 mEq/L (ref 96–112)
Creatinine, Ser: 0.69 mg/dL (ref 0.40–1.20)
GFR: 88.63 mL/min (ref >60.00)
Glucose, Bld: 94 mg/dL (ref 70–99)
Potassium: 4.5 mEq/L (ref 3.5–5.1)
Sodium: 140 mEq/L (ref 135–145)
Total Bilirubin: 0.7 mg/dL (ref 0.2–1.2)
Total Protein: 7.2 g/dL (ref 6.0–8.3)

## 2016-08-17 LAB — LIPID PANEL
Cholesterol: 250 mg/dL — ABNORMAL HIGH (ref 0–200)
HDL: 82.9 mg/dL (ref >39.00)
LDL Cholesterol: 146 mg/dL — ABNORMAL HIGH (ref 0–99)
NonHDL: 166.8
Total CHOL/HDL Ratio: 3
Triglycerides: 106 mg/dL (ref 0.0–149.0)
VLDL: 21.2 mg/dL (ref 0.0–40.0)

## 2016-08-17 LAB — TSH: TSH: 2.15 u[IU]/mL (ref 0.35–4.50)

## 2016-08-17 MED ORDER — LOSARTAN POTASSIUM 50 MG PO TABS: 50.0000 mg | ORAL_TABLET | Freq: Every day | ORAL | 1 refills | Status: DC

## 2016-08-17 NOTE — Progress Notes (Signed)
Subjective:  Patient ID: Kaitlyn Roman, female    DOB: 1946/04/13  Age: 71 y.o. MRN: [id number]  CC: The primary encounter diagnosis was Need for 23-polyvalent pneumococcal polysaccharide vaccine. Diagnoses of Essential hypertension, Pure hypercholesterolemia, and Other fatigue were also pertinent to this visit.  HPI Kaitlyn Roman presents for FOLLOW UP  ON HYPERTENSION AND  HYPERLIPIDEMIA.  Patient is taking her medications as prescribed and notes no adverse effects.  Home BP readings have been done about once per week and are  generally < 130/80 .  She is avoiding added salt in her diet and walking regularly about 3 times per week for exercise  .    Outpatient Medications Prior to Visit  Medication Sig Dispense Refill  . aspirin 81 MG tablet Take 81 mg by mouth daily.    [name] atorvastatin (LIPITOR) 40 MG tablet TAKE 1 TABLET EVERY DAY 90 tablet 0  . Cholecalciferol (VITAMIN D-3) 1000 UNITS CAPS Take 1 capsule by mouth daily.    [name] doxylamine, Sleep, (UNISOM) 25 MG tablet Take 12.5 mg by mouth at bedtime as needed for sleep.     . fluticasone (FLONASE) 50 MCG/ACT nasal spray Place 2 sprays into both nostrils daily. 16 g 6  . losartan-hydrochlorothiazide (HYZAAR) 50-12.5 MG tablet Take 1 tablet by mouth daily. 90 tablet 0  . meloxicam (MOBIC) 15 MG tablet Take 1 tablet (15 mg total) by mouth daily. 90 tablet 1  . Probiotic Product (PROBIOTIC PO) Take 1 capsule by mouth daily.    . Multiple Vitamins-Minerals (HAIR/SKIN/NAILS/BIOTIN PO) Take 1 tablet by mouth.     No facility-administered medications prior to visit.     Review of Systems;  Patient denies headache, fevers, malaise, unintentional weight loss, skin rash, eye pain, sinus congestion and sinus pain, sore throat, dysphagia,  hemoptysis , cough, dyspnea, wheezing, chest pain, palpitations, orthopnea, edema, abdominal pain, nausea, melena, diarrhea, constipation, flank pain, dysuria, hematuria, urinary  Frequency, nocturia,  numbness, tingling, seizures,  Focal weakness, Loss of consciousness,  Tremor, insomnia, depression, anxiety, and suicidal ideation.      Objective:  BP 132/88 (BP Location: Left Arm, Patient Position: Sitting, Cuff Size: Normal)   Pulse 70   Temp 98.3 F (36.8 C) (Oral)   Resp 16   Ht 5' 1.25" (1.556 m)   Wt 131 lb 9.6 oz (59.7 kg)   SpO2 97%   BMI 24.66 kg/m   BP Readings from Last 3 Encounters:  08/17/16 132/88  06/06/16 130/78  12/20/15 132/82    Wt Readings from Last 3 Encounters:  08/17/16 131 lb 9.6 oz (59.7 kg)  06/06/16 130 lb 12.8 oz (59.3 kg)  12/20/15 135 lb (61.2 kg)    General appearance: alert, cooperative and appears stated age Ears: normal TM's and external ear canals both ears Throat: lips, mucosa, and tongue normal; teeth and gums normal Neck: no adenopathy, no carotid bruit, supple, symmetrical, trachea midline and thyroid not enlarged, symmetric, no tenderness/mass/nodules Back: symmetric, no curvature. ROM normal. No CVA tenderness. Lungs: clear to auscultation bilaterally Heart: regular rate and rhythm, S1, S2 normal, no murmur, click, rub or gallop Abdomen: soft, non-tender; bowel sounds normal; no masses,  no organomegaly Pulses: 2+ and symmetric Skin: Skin color, texture, turgor normal. No rashes or lesions Lymph nodes: Cervical, supraclavicular, and axillary nodes normal.  No results found for: HGBA1C  Lab Results  Component Value Date   CREATININE 0.69 08/17/2016   CREATININE 0.80 06/12/2016   CREATININE 0.82 12/20/2015    Lab  Results  Component Value Date   WBC 4.6 08/17/2016   HGB 14.1 08/17/2016   HCT 41.9 08/17/2016   PLT 295.0 08/17/2016   GLUCOSE 94 08/17/2016   CHOL 250 (H) 08/17/2016   TRIG 106.0 08/17/2016   HDL 82.90 08/17/2016   LDLDIRECT 125.0 06/12/2016   LDLCALC 146 (H) 08/17/2016   ALT 11 08/17/2016   AST 19 08/17/2016   NA 140 08/17/2016   K 4.5 08/17/2016   CL 102 08/17/2016   CREATININE 0.69 08/17/2016    BUN 18 08/17/2016   CO2 29 08/17/2016   TSH 2.15 08/17/2016    No results found.  Assessment & Plan:   Problem List Items Addressed This Visit    Hyperlipidemia    Managed with 1/2 atorvastatin tablet (20 mg dose) daily . 10 yr risk is `15% using the FRC , and liver enzymes normal.  Will recommend increasing to full tablet dose 40 mg   Lab Results  Component Value Date   CHOL 250 (H) 08/17/2016   HDL 82.90 08/17/2016   LDLCALC 146 (H) 08/17/2016   LDLDIRECT 125.0 06/12/2016   TRIG 106.0 08/17/2016   CHOLHDL 3 08/17/2016   Lab Results  Component Value Date   ALT 11 08/17/2016   AST 19 08/17/2016   ALKPHOS 66 08/17/2016   BILITOT 0.7 08/17/2016         Relevant Medications   losartan (COZAAR) 50 MG tablet   Other Relevant Orders   Lipid panel (Completed)   Hypertension    NOT AT GOAL,  WILL INCREASE LOSARTAN TO 100 MG DAILY. RENAL FUNCTION AND LYTES ARE NORMAL.   Lab Results  Component Value Date   CREATININE 0.69 08/17/2016   Lab Results  Component Value Date   NA 140 08/17/2016   K 4.5 08/17/2016   CL 102 08/17/2016   CO2 29 08/17/2016         Relevant Medications   losartan (COZAAR) 50 MG tablet   Other Relevant Orders   Comprehensive metabolic panel (Completed)    Other Visit Diagnoses    Need for 23-polyvalent pneumococcal polysaccharide vaccine    -  Primary   Relevant Orders   Pneumococcal polysaccharide vaccine 23-valent greater than or equal to 2yo subcutaneous/IM (Completed)   Other fatigue       Relevant Orders   TSH (Completed)   CBC with Differential/Platelet (Completed)      I have discontinued [name]'s Multiple Vitamins-Minerals (HAIR/SKIN/NAILS/BIOTIN PO). I am also having her start on losartan. Additionally, I am having her maintain her aspirin, doxylamine (Sleep), Vitamin D-3, meloxicam, fluticasone, Probiotic Product (PROBIOTIC PO), atorvastatin, losartan-hydrochlorothiazide, and glucosamine-chondroitin.  Meds ordered this  encounter  Medications  . glucosamine-chondroitin 500-400 MG tablet    Sig: Take 2 tablets by mouth daily.  [name] losartan (COZAAR) 50 MG tablet    Sig: Take 1 tablet (50 mg total) by mouth daily.    Dispense:  30 tablet    Refill:  1    Medications Discontinued During This Encounter  Medication Reason  . Multiple Vitamins-Minerals (HAIR/SKIN/NAILS/BIOTIN PO) Patient has not taken in last 30 days    Follow-up: No Follow-up on file.   Crecencio Mc, MD

## 2016-08-17 NOTE — Progress Notes (Signed)
Pre visit review using our clinic review tool, if applicable. No additional management support is needed unless otherwise documented below in the visit note. 

## 2016-08-17 NOTE — Patient Instructions (Addendum)
Blood pressure is not at goal   Goal is now 120/70 or less .  I would like to increase your dose of losartan to 100 mg.   Fill the 50 mg losartan rx I gave you today,  And add it to once tablet daily of  your current medication, so you will be taking 100 mg losartan, and 12.5 mg  Of  hctz as a total daily dose .    If you tolerate these doses,  We will combine them into one pill down the road   You are due for colonoscopy  in Dec 2013  Per Dr Vira Agar

## 2016-08-19 ENCOUNTER — Encounter: Payer: Self-pay | Admitting: Internal Medicine

## 2016-08-19 NOTE — Assessment & Plan Note (Addendum)
Managed with 1/2 atorvastatin tablet (20 mg dose) daily . 10 yr risk is `15% using the FRC , and liver enzymes normal.  Will recommend increasing to full tablet dose 40 mg   Lab Results  Component Value Date   CHOL 250 (H) 08/17/2016   HDL 82.90 08/17/2016   LDLCALC 146 (H) 08/17/2016   LDLDIRECT 125.0 06/12/2016   TRIG 106.0 08/17/2016   CHOLHDL 3 08/17/2016   Lab Results  Component Value Date   ALT 11 08/17/2016   AST 19 08/17/2016   ALKPHOS 66 08/17/2016   BILITOT 0.7 08/17/2016

## 2016-08-19 NOTE — Assessment & Plan Note (Addendum)
NOT AT GOAL,  WILL INCREASE LOSARTAN TO 100 MG DAILY. RENAL FUNCTION AND LYTES ARE NORMAL.   Lab Results  Component Value Date   CREATININE 0.69 08/17/2016   Lab Results  Component Value Date   NA 140 08/17/2016   K 4.5 08/17/2016   CL 102 08/17/2016   CO2 29 08/17/2016

## 2016-08-23 ENCOUNTER — Encounter: Payer: Self-pay | Admitting: Internal Medicine

## 2016-11-15 DIAGNOSIS — Z9841 Cataract extraction status, right eye: Secondary | ICD-10-CM | POA: Diagnosis not present

## 2016-11-15 DIAGNOSIS — H52223 Regular astigmatism, bilateral: Secondary | ICD-10-CM | POA: Diagnosis not present

## 2016-11-15 DIAGNOSIS — Z9842 Cataract extraction status, left eye: Secondary | ICD-10-CM | POA: Diagnosis not present

## 2016-11-19 ENCOUNTER — Encounter: Payer: Self-pay | Admitting: Internal Medicine

## 2016-11-21 MED ORDER — ATORVASTATIN CALCIUM 40 MG PO TABS: ORAL_TABLET | ORAL | 0 refills | Status: DC

## 2016-11-21 NOTE — Telephone Encounter (Signed)
Spoke with the patient and refilled the RX to [facility] for the increased dosing at 40mg . Thanks

## 2016-12-14 ENCOUNTER — Other Ambulatory Visit: Payer: Self-pay

## 2017-01-07 DIAGNOSIS — M542 Cervicalgia: Secondary | ICD-10-CM | POA: Diagnosis not present

## 2017-01-09 DIAGNOSIS — M542 Cervicalgia: Secondary | ICD-10-CM | POA: Diagnosis not present

## 2017-02-20 ENCOUNTER — Ambulatory Visit: Payer: Medicare Other | Admitting: Internal Medicine

## 2017-02-22 ENCOUNTER — Encounter: Payer: Self-pay | Admitting: Internal Medicine

## 2017-02-22 ENCOUNTER — Ambulatory Visit (INDEPENDENT_AMBULATORY_CARE_PROVIDER_SITE_OTHER): Payer: Medicare Other | Admitting: Internal Medicine

## 2017-02-22 VITALS — BP 142/92 | HR 61 | Temp 98.3°F | Resp 14 | Ht 61.25 in | Wt 130.0 lb

## 2017-02-22 DIAGNOSIS — M159 Polyosteoarthritis, unspecified: Secondary | ICD-10-CM

## 2017-02-22 DIAGNOSIS — M353 Polymyalgia rheumatica: Secondary | ICD-10-CM | POA: Diagnosis not present

## 2017-02-22 DIAGNOSIS — I10 Essential (primary) hypertension: Secondary | ICD-10-CM

## 2017-02-22 DIAGNOSIS — R5383 Other fatigue: Secondary | ICD-10-CM

## 2017-02-22 DIAGNOSIS — E559 Vitamin D deficiency, unspecified: Secondary | ICD-10-CM | POA: Diagnosis not present

## 2017-02-22 DIAGNOSIS — M8949 Other hypertrophic osteoarthropathy, multiple sites: Secondary | ICD-10-CM

## 2017-02-22 DIAGNOSIS — M150 Primary generalized (osteo)arthritis: Secondary | ICD-10-CM | POA: Diagnosis not present

## 2017-02-22 DIAGNOSIS — E7800 Pure hypercholesterolemia, unspecified: Secondary | ICD-10-CM

## 2017-02-22 DIAGNOSIS — M2550 Pain in unspecified joint: Secondary | ICD-10-CM

## 2017-02-22 DIAGNOSIS — Z23 Encounter for immunization: Secondary | ICD-10-CM | POA: Diagnosis not present

## 2017-02-22 LAB — VITAMIN D 25 HYDROXY (VIT D DEFICIENCY, FRACTURES): VITD: 41.95 ng/mL (ref 30.00–100.00)

## 2017-02-22 LAB — COMPREHENSIVE METABOLIC PANEL
ALT: 12 U/L (ref 0–35)
AST: 20 U/L (ref 0–37)
Albumin: 4.4 g/dL (ref 3.5–5.2)
Alkaline Phosphatase: 73 U/L (ref 39–117)
BUN: 18 mg/dL (ref 6–23)
CO2: 30 mEq/L (ref 19–32)
Calcium: 9.9 mg/dL (ref 8.4–10.5)
Chloride: 98 mEq/L (ref 96–112)
Creatinine, Ser: 0.71 mg/dL (ref 0.40–1.20)
GFR: 85.63 mL/min (ref >60.00)
Glucose, Bld: 103 mg/dL — ABNORMAL HIGH (ref 70–99)
Potassium: 4.1 mEq/L (ref 3.5–5.1)
Sodium: 135 mEq/L (ref 135–145)
Total Bilirubin: 0.7 mg/dL (ref 0.2–1.2)
Total Protein: 7.3 g/dL (ref 6.0–8.3)

## 2017-02-22 LAB — SEDIMENTATION RATE: Sed Rate: 25 mm/hr (ref 0–30)

## 2017-02-22 LAB — LIPID PANEL
Cholesterol: 224 mg/dL — ABNORMAL HIGH (ref 0–200)
HDL: 81.9 mg/dL (ref >39.00)
LDL Cholesterol: 124 mg/dL — ABNORMAL HIGH (ref 0–99)
NonHDL: 142.05
Total CHOL/HDL Ratio: 3
Triglycerides: 92 mg/dL (ref 0.0–149.0)
VLDL: 18.4 mg/dL (ref 0.0–40.0)

## 2017-02-22 MED ORDER — LOSARTAN POTASSIUM-HCTZ 100-12.5 MG PO TABS: 1.0000 | ORAL_TABLET | Freq: Every day | ORAL | 3 refills | Status: DC

## 2017-02-22 MED ORDER — ZOSTER VAC RECOMB ADJUVANTED 50 MCG/0.5ML IM SUSR: 0.5000 mL | Freq: Once | INTRAMUSCULAR | 1 refills | Status: AC

## 2017-02-22 NOTE — Patient Instructions (Addendum)
  I have increased your losartan  hct to 100/12.5 .   If the cholesterol is too high today , I will increase the  atorvastatin  Dose  from 40 mg to 80 mg    The new shingles (ShingRx)  vaccine is now available in local pharmacies and is much more protective thant Zostavaxs,  It is therefore ADVISED for all interested adults over 50 to prevent shingles   You had the old shingles vaccine in 2012.

## 2017-02-22 NOTE — Progress Notes (Signed)
Subjective:  Patient ID: Kaitlyn Roman, female    DOB: [birth year]  Age: 71 y.o. MRN: [id number]  CC: The primary encounter diagnosis was Vitamin D deficiency. Diagnoses of Encounter for immunization, Essential hypertension, Arthralgia, unspecified joint, Pure hypercholesterolemia, Fatigue, unspecified type, Polymyalgia rheumatica ([place]), and Primary osteoarthritis involving multiple joints were also pertinent to this visit.  HPI [name] presents for follow up on hypertension, hyperlipoidemia .   Hypertension: patient checks blood pressure twice weekly at home.  Readings have been for the most part < 140/80 at rest . Patient is following a reduced salt diet most days and is taking medications as prescribed.  She is exercising 3 times per week using a treadmill and wathcing her diet.  Has been having some joint pain in hips and lower back,  Has history of PMR in remission for over a year.   Outpatient Medications Prior to Visit  Medication Sig Dispense Refill  . aspirin 81 MG tablet Take 81 mg by mouth daily.    [name] atorvastatin (LIPITOR) 40 MG tablet TAKE 1 TABLET EVERY DAY 90 tablet 0  . Cholecalciferol (VITAMIN D-3) 1000 UNITS CAPS Take 1 capsule by mouth daily.    [name] doxylamine, Sleep, (UNISOM) 25 MG tablet Take 12.5 mg by mouth at bedtime as needed for sleep.     . fluticasone (FLONASE) 50 MCG/ACT nasal spray Place 2 sprays into both nostrils daily. 16 g 6  . glucosamine-chondroitin 500-400 MG tablet Take 2 tablets by mouth daily.    . meloxicam (MOBIC) 15 MG tablet Take 1 tablet (15 mg total) by mouth daily. 90 tablet 1  . Probiotic Product (PROBIOTIC PO) Take 1 capsule by mouth daily.    [name] losartan-hydrochlorothiazide (HYZAAR) 50-12.5 MG tablet Take 1 tablet by mouth daily. 90 tablet 0  . losartan (COZAAR) 50 MG tablet Take 1 tablet (50 mg total) by mouth daily. (Patient not taking: Reported on 02/22/2017) 30 tablet 1   No facility-administered medications prior to visit.      Review of Systems;  Patient denies headache, fevers, malaise, unintentional weight loss, skin rash, eye pain, sinus congestion and sinus pain, sore throat, dysphagia,  hemoptysis , cough, dyspnea, wheezing, chest pain, palpitations, orthopnea, edema, abdominal pain, nausea, melena, diarrhea, constipation, flank pain, dysuria, hematuria, urinary  Frequency, nocturia, numbness, tingling, seizures,  Focal weakness, Loss of consciousness,  Tremor, insomnia, depression, anxiety, and suicidal ideation.      Objective:  BP (!) 142/92 (BP Location: Left Arm, Patient Position: Sitting, Cuff Size: Normal)   Pulse 61   Temp 98.3 F (36.8 C) (Oral)   Resp 14   Ht 5' 1.25" (1.556 m)   Wt 130 lb (59 kg)   SpO2 98%   BMI 24.36 kg/m   BP Readings from Last 3 Encounters:  02/22/17 (!) 142/92  08/17/16 132/88  06/06/16 130/78    Wt Readings from Last 3 Encounters:  02/22/17 130 lb (59 kg)  08/17/16 131 lb 9.6 oz (59.7 kg)  06/06/16 130 lb 12.8 oz (59.3 kg)    General appearance: alert, cooperative and appears stated age Ears: normal TM's and external ear canals both ears Throat: lips, mucosa, and tongue normal; teeth and gums normal Neck: no adenopathy, no carotid bruit, supple, symmetrical, trachea midline and thyroid not enlarged, symmetric, no tenderness/mass/nodules Back: symmetric, no curvature. ROM normal. No CVA tenderness. Lungs: clear to auscultation bilaterally Heart: regular rate and rhythm, S1, S2 normal, no murmur, click, rub or gallop Abdomen: soft, non-tender; bowel sounds  normal; no masses,  no organomegaly Pulses: 2+ and symmetric Skin: Skin color, texture, turgor normal. No rashes or lesions Lymph nodes: Cervical, supraclavicular, and axillary nodes normal.  No results found for: HGBA1C  Lab Results  Component Value Date   CREATININE 0.71 02/22/2017   CREATININE 0.69 08/17/2016   CREATININE 0.80 06/12/2016    Lab Results  Component Value Date   WBC 4.6  08/17/2016   HGB 14.1 08/17/2016   HCT 41.9 08/17/2016   PLT 295.0 08/17/2016   GLUCOSE 103 (H) 02/22/2017   CHOL 224 (H) 02/22/2017   TRIG 92.0 02/22/2017   HDL 81.90 02/22/2017   LDLDIRECT 125.0 06/12/2016   LDLCALC 124 (H) 02/22/2017   ALT 12 02/22/2017   AST 20 02/22/2017   NA 135 02/22/2017   K 4.1 02/22/2017   CL 98 02/22/2017   CREATININE 0.71 02/22/2017   BUN 18 02/22/2017   CO2 30 02/22/2017   TSH 2.15 08/17/2016    No results found.  Assessment & Plan:   Problem List Items Addressed This Visit    Hyperlipidemia    LDL and triglycerides are at goal on current medications. She has no side effects and liver enzymes are normal. No changes today  Lab Results  Component Value Date   CHOL 224 (H) 02/22/2017   HDL 81.90 02/22/2017   LDLCALC 124 (H) 02/22/2017   LDLDIRECT 125.0 06/12/2016   TRIG 92.0 02/22/2017   CHOLHDL 3 02/22/2017   Lab Results  Component Value Date   ALT 12 02/22/2017   AST 20 02/22/2017   ALKPHOS 73 02/22/2017   BILITOT 0.7 02/22/2017          Relevant Medications   losartan-hydrochlorothiazide (HYZAAR) 100-12.5 MG tablet   Other Relevant Orders   Lipid panel (Completed)   Hypertension    Not at goal on current regimen. Renal function stable, will increase losartan to 100 mg   Lab Results  Component Value Date   CREATININE 0.71 02/22/2017   Lab Results  Component Value Date   NA 135 02/22/2017   K 4.1 02/22/2017   CL 98 02/22/2017   CO2 30 02/22/2017         Relevant Medications   losartan-hydrochlorothiazide (HYZAAR) 100-12.5 MG tablet   Other Relevant Orders   Comprehensive metabolic panel (Completed)   OA (osteoarthritis)    Managed with meloxicam.  No G I upset.       Polymyalgia rheumatica ([facility])    Still in remission.  ESR is normal.  Lab Results  Component Value Date   ESRSEDRATE 25 02/22/2017          Other Visit Diagnoses    Vitamin D deficiency    -  Primary   Relevant Orders   VITAMIN D 25  Hydroxy (Vit-D Deficiency, Fractures) (Completed)   Encounter for immunization       Relevant Orders   Flu vaccine HIGH DOSE PF (Completed)   Arthralgia, unspecified joint       Relevant Orders   Sedimentation rate (Completed)   VITAMIN D 25 Hydroxy (Vit-D Deficiency, Fractures) (Completed)   Fatigue, unspecified type       Relevant Orders   Hepatitis C antibody (Completed)      I have discontinued Ms. Ryker's losartan-hydrochlorothiazide and losartan. I am also having her start on losartan-hydrochlorothiazide and Zoster Vac Recomb Adjuvanted. Additionally, I am having her maintain her aspirin, doxylamine (Sleep), Vitamin D-3, meloxicam, fluticasone, Probiotic Product (PROBIOTIC PO), glucosamine-chondroitin, atorvastatin, Co Q 10, Potassium, and  Multiple Vitamins-Minerals (HAIR SKIN AND NAILS FORMULA PO).  Meds ordered this encounter  Medications  . Coenzyme Q10 (CO Q 10) 100 MG CAPS    Sig: Take 1 capsule by mouth daily.  . Potassium 99 MG TABS    Sig: Take 1 tablet by mouth daily.  . Multiple Vitamins-Minerals (HAIR SKIN AND NAILS FORMULA PO)    Sig: Take 1 tablet by mouth daily.  [name] losartan-hydrochlorothiazide (HYZAAR) 100-12.5 MG tablet    Sig: Take 1 tablet by mouth daily.    Dispense:  90 tablet    Refill:  3  . Zoster Vac Recomb Adjuvanted [facility]) injection    Sig: Inject 0.5 mLs into the muscle once.    Dispense:  1 each    Refill:  1    Medications Discontinued During This Encounter  Medication Reason  . losartan (COZAAR) 50 MG tablet Patient has not taken in last 30 days  . losartan-hydrochlorothiazide (HYZAAR) 50-12.5 MG tablet     Follow-up: Return in about 6 months (around 08/22/2017).   Crecencio Mc, MD

## 2017-02-23 LAB — HEPATITIS C ANTIBODY
Hepatitis C Ab: NONREACTIVE
SIGNAL TO CUT-OFF: 0.11 (ref <1.00)

## 2017-02-24 NOTE — Assessment & Plan Note (Signed)
LDL and triglycerides are at goal on current medications. She has no side effects and liver enzymes are normal. No changes today  Lab Results  Component Value Date   CHOL 224 (H) 02/22/2017   HDL 81.90 02/22/2017   LDLCALC 124 (H) 02/22/2017   LDLDIRECT 125.0 06/12/2016   TRIG 92.0 02/22/2017   CHOLHDL 3 02/22/2017   Lab Results  Component Value Date   ALT 12 02/22/2017   AST 20 02/22/2017   ALKPHOS 73 02/22/2017   BILITOT 0.7 02/22/2017

## 2017-02-24 NOTE — Assessment & Plan Note (Signed)
Managed with meloxicam.  No G I upset.

## 2017-02-24 NOTE — Assessment & Plan Note (Addendum)
Not at goal on current regimen. Renal function stable, will increase losartan to 100 mg   Lab Results  Component Value Date   CREATININE 0.71 02/22/2017   Lab Results  Component Value Date   NA 135 02/22/2017   K 4.1 02/22/2017   CL 98 02/22/2017   CO2 30 02/22/2017

## 2017-02-24 NOTE — Assessment & Plan Note (Addendum)
Still in remission.  ESR is normal.  Lab Results  Component Value Date   ESRSEDRATE 25 02/22/2017

## 2017-03-13 DIAGNOSIS — L538 Other specified erythematous conditions: Secondary | ICD-10-CM | POA: Diagnosis not present

## 2017-03-13 DIAGNOSIS — L821 Other seborrheic keratosis: Secondary | ICD-10-CM | POA: Diagnosis not present

## 2017-03-13 DIAGNOSIS — B078 Other viral warts: Secondary | ICD-10-CM | POA: Diagnosis not present

## 2017-03-13 DIAGNOSIS — L720 Epidermal cyst: Secondary | ICD-10-CM | POA: Diagnosis not present

## 2017-03-13 DIAGNOSIS — L710 Perioral dermatitis: Secondary | ICD-10-CM | POA: Diagnosis not present

## 2017-04-25 ENCOUNTER — Other Ambulatory Visit: Payer: Self-pay | Admitting: Internal Medicine

## 2017-05-15 DIAGNOSIS — N281 Cyst of kidney, acquired: Secondary | ICD-10-CM | POA: Diagnosis not present

## 2017-05-15 DIAGNOSIS — N189 Chronic kidney disease, unspecified: Secondary | ICD-10-CM | POA: Diagnosis not present

## 2017-05-16 DIAGNOSIS — M1712 Unilateral primary osteoarthritis, left knee: Secondary | ICD-10-CM | POA: Diagnosis not present

## 2017-06-06 ENCOUNTER — Ambulatory Visit: Payer: Medicare Other

## 2017-06-06 ENCOUNTER — Telehealth: Payer: Self-pay | Admitting: Internal Medicine

## 2017-06-06 DIAGNOSIS — R69 Illness, unspecified: Secondary | ICD-10-CM | POA: Diagnosis not present

## 2017-06-06 DIAGNOSIS — I10 Essential (primary) hypertension: Secondary | ICD-10-CM

## 2017-06-06 NOTE — Telephone Encounter (Signed)
Copied from [place] [id number]. Topic: Quick Communication - See Telephone Encounter >> Jun 06, 2017  1:36 PM Bea Graff, NT wrote: CRM for notification. See Telephone encounter for: Pt has new coverage for her prescriptions, [employer] and is needing new prescriptions wrote so that she can now go to [facility] to have her medicines filled. She is requesting a hardcopy of each rx so that she may take it to the pharmacy. She is needing atorvastatin (LIPITOR) 40mg , losartan-hydrochlorothiazide (HYZAAR), and meloxicam (MOBIC). Please call pt once ready for her to pick up hard copies.   06/06/17.

## 2017-06-06 NOTE — Telephone Encounter (Signed)
Please advise 

## 2017-06-10 MED ORDER — ATORVASTATIN CALCIUM 40 MG PO TABS: ORAL_TABLET | ORAL | 1 refills | Status: AC

## 2017-06-10 MED ORDER — MELOXICAM 15 MG PO TABS: 15.0000 mg | ORAL_TABLET | Freq: Every day | ORAL | 1 refills | Status: AC

## 2017-06-10 MED ORDER — LOSARTAN POTASSIUM-HCTZ 100-12.5 MG PO TABS: 1.0000 | ORAL_TABLET | Freq: Every day | ORAL | 1 refills | Status: AC

## 2017-06-10 NOTE — Telephone Encounter (Signed)
Pt has been notified that rxs are ready to be picked up.

## 2017-06-10 NOTE — Telephone Encounter (Signed)
Rxs have been printed and placed in Dr. Lupita Dawn quick sign folder to be signed.

## 2017-06-27 DIAGNOSIS — Z86010 Personal history of colonic polyps: Secondary | ICD-10-CM | POA: Diagnosis not present

## 2017-07-03 DIAGNOSIS — N281 Cyst of kidney, acquired: Secondary | ICD-10-CM | POA: Diagnosis not present

## 2017-07-03 DIAGNOSIS — M150 Primary generalized (osteo)arthritis: Secondary | ICD-10-CM | POA: Diagnosis not present

## 2017-07-03 DIAGNOSIS — Z1211 Encounter for screening for malignant neoplasm of colon: Secondary | ICD-10-CM | POA: Diagnosis not present

## 2017-07-03 DIAGNOSIS — I10 Essential (primary) hypertension: Secondary | ICD-10-CM | POA: Diagnosis not present

## 2017-07-03 DIAGNOSIS — Z1231 Encounter for screening mammogram for malignant neoplasm of breast: Secondary | ICD-10-CM | POA: Diagnosis not present

## 2017-07-03 DIAGNOSIS — E7800 Pure hypercholesterolemia, unspecified: Secondary | ICD-10-CM | POA: Diagnosis not present

## 2017-07-03 DIAGNOSIS — M353 Polymyalgia rheumatica: Secondary | ICD-10-CM | POA: Diagnosis not present

## 2017-08-23 ENCOUNTER — Ambulatory Visit: Payer: Medicare Other | Admitting: Internal Medicine

## 2017-08-26 DIAGNOSIS — Z1231 Encounter for screening mammogram for malignant neoplasm of breast: Secondary | ICD-10-CM | POA: Diagnosis not present

## 2017-09-06 ENCOUNTER — Encounter: Payer: Self-pay | Admitting: *Deleted

## 2017-09-09 ENCOUNTER — Ambulatory Visit: Payer: Medicare HMO | Admitting: Certified Registered"

## 2017-09-09 ENCOUNTER — Ambulatory Visit
Admission: RE | Admit: 2017-09-09 | Discharge: 2017-09-09 | Disposition: A | Discharge status: Home / Self Care | Payer: Medicare HMO | Source: Ambulatory Visit | Attending: Unknown Physician Specialty | Admitting: Unknown Physician Specialty

## 2017-09-09 ENCOUNTER — Encounter
Admission: RE | Disposition: A | Discharge status: Home / Self Care | Payer: Self-pay | Source: Ambulatory Visit | Attending: Unknown Physician Specialty

## 2017-09-09 DIAGNOSIS — K640 First degree hemorrhoids: Secondary | ICD-10-CM | POA: Diagnosis not present

## 2017-09-09 DIAGNOSIS — D125 Benign neoplasm of sigmoid colon: Secondary | ICD-10-CM | POA: Diagnosis not present

## 2017-09-09 DIAGNOSIS — N189 Chronic kidney disease, unspecified: Secondary | ICD-10-CM | POA: Insufficient documentation

## 2017-09-09 DIAGNOSIS — M1990 Unspecified osteoarthritis, unspecified site: Secondary | ICD-10-CM | POA: Diagnosis not present

## 2017-09-09 DIAGNOSIS — Z86010 Personal history of colonic polyps: Secondary | ICD-10-CM | POA: Diagnosis not present

## 2017-09-09 DIAGNOSIS — I129 Hypertensive chronic kidney disease with stage 1 through stage 4 chronic kidney disease, or unspecified chronic kidney disease: Secondary | ICD-10-CM | POA: Insufficient documentation

## 2017-09-09 DIAGNOSIS — Z1211 Encounter for screening for malignant neoplasm of colon: Secondary | ICD-10-CM | POA: Insufficient documentation

## 2017-09-09 DIAGNOSIS — Z7982 Long term (current) use of aspirin: Secondary | ICD-10-CM | POA: Insufficient documentation

## 2017-09-09 DIAGNOSIS — K635 Polyp of colon: Secondary | ICD-10-CM | POA: Insufficient documentation

## 2017-09-09 DIAGNOSIS — M353 Polymyalgia rheumatica: Secondary | ICD-10-CM | POA: Diagnosis not present

## 2017-09-09 DIAGNOSIS — Z79899 Other long term (current) drug therapy: Secondary | ICD-10-CM | POA: Insufficient documentation

## 2017-09-09 DIAGNOSIS — E785 Hyperlipidemia, unspecified: Secondary | ICD-10-CM | POA: Insufficient documentation

## 2017-09-09 DIAGNOSIS — K648 Other hemorrhoids: Secondary | ICD-10-CM | POA: Diagnosis not present

## 2017-09-09 HISTORY — PX: COLONOSCOPY WITH PROPOFOL: SHX5780

## 2017-09-09 HISTORY — DX: Chronic kidney disease, unspecified: N18.9

## 2017-09-09 HISTORY — DX: Unspecified osteoarthritis, unspecified site: M19.90

## 2017-09-09 HISTORY — DX: Neoplasm of unspecified behavior of brain: D49.6

## 2017-09-09 SURGERY — COLONOSCOPY WITH PROPOFOL
Anesthesia: General

## 2017-09-09 MED ORDER — LIDOCAINE HCL (CARDIAC) 20 MG/ML IV SOLN
INTRAVENOUS | Status: DC | PRN
  Administered 2017-09-09: 50 mg via INTRAVENOUS

## 2017-09-09 MED ORDER — PROPOFOL 500 MG/50ML IV EMUL
INTRAVENOUS | Status: AC
  Filled 2017-09-09: qty 50

## 2017-09-09 MED ORDER — PROPOFOL 10 MG/ML IV BOLUS
INTRAVENOUS | Status: DC | PRN
  Administered 2017-09-09: 70 mg via INTRAVENOUS

## 2017-09-09 MED ORDER — SODIUM CHLORIDE 0.9 % IV SOLN: INTRAVENOUS | Status: DC

## 2017-09-09 MED ORDER — PROPOFOL 500 MG/50ML IV EMUL
INTRAVENOUS | Status: DC | PRN
  Administered 2017-09-09: 130 ug/kg/min via INTRAVENOUS

## 2017-09-09 MED ORDER — SODIUM CHLORIDE 0.9 % IV SOLN
INTRAVENOUS | Status: DC
  Administered 2017-09-09: 12:00:00 via INTRAVENOUS

## 2017-09-09 NOTE — Anesthesia Preprocedure Evaluation (Signed)
Anesthesia Evaluation  Patient identified by MRN, date of birth, ID band Patient awake    Reviewed: Allergy & Precautions, NPO status , Patient's Chart, lab work & pertinent test results, reviewed documented beta blocker date and time   Airway Mallampati: II  TM Distance: >3 FB     Dental  (+) Chipped   Pulmonary           Cardiovascular hypertension, Pt. on medications      Neuro/Psych    GI/Hepatic   Endo/Other    Renal/GU Renal disease     Musculoskeletal  (+) Arthritis ,   Abdominal   Peds  Hematology   Anesthesia Other Findings Acoustic neuroma.  Reproductive/Obstetrics                             Anesthesia Physical Anesthesia Plan  ASA: III  Anesthesia Plan: General   Post-op Pain Management:    Induction: Intravenous  PONV Risk Score and Plan:   Airway Management Planned:   Additional Equipment:   Intra-op Plan:   Post-operative Plan:   Informed Consent: I have reviewed the patients History and Physical, chart, labs and discussed the procedure including the risks, benefits and alternatives for the proposed anesthesia with the patient or authorized representative who has indicated his/her understanding and acceptance.     Plan Discussed with: CRNA  Anesthesia Plan Comments:         Anesthesia Quick Evaluation

## 2017-09-09 NOTE — H&P (Signed)
Primary Care Physician:  Anastasio Champion, MD Primary Gastroenterologist:  Dr. Vira Agar  Pre-Procedure History & Physical: HPI:  Kaitlyn Roman is a 72 y.o. female is here for an colonoscopy.   Past Medical History:  Diagnosis Date  . Arthritis   . Brain tumor [place]   . Chronic kidney disease    renal cysts  . Colitis, infectious April 2013    C dificile colitis, hosp [facility] April 2013 x 2 weeks  . Hyperlipidemia   . Hypertension   . Polymyalgia rheumatica syndrome [facility])    rheumatoloigst Dr Zenia Resides at [facility]    Past Surgical History:  Procedure Laterality Date  . ABDOMINAL HYSTERECTOMY    . COLONOSCOPY    . CRANIECTOMY FOR EXCISION OF ACOUSTIC NEUROMA      Prior to Admission medications   Medication Sig Start Date End Date Taking? Authorizing Provider  atorvastatin (LIPITOR) 40 MG tablet take 1 tablet by mouth once daily 06/10/17  Yes Crecencio Mc, MD  doxylamine, Sleep, (UNISOM) 25 MG tablet Take 12.5 mg by mouth at bedtime as needed for sleep.    Yes [provider]  fluticasone (FLONASE) 50 MCG/ACT nasal spray Place 2 sprays into both nostrils daily. 05/24/14  Yes Crecencio Mc, MD  losartan-hydrochlorothiazide (HYZAAR) 100-12.5 MG tablet Take 1 tablet by mouth daily. 06/10/17  Yes Crecencio Mc, MD  meloxicam (MOBIC) 15 MG tablet Take 1 tablet (15 mg total) by mouth daily. 06/10/17  Yes Crecencio Mc, MD  aspirin 81 MG tablet Take 81 mg by mouth daily.    [provider]  Cholecalciferol (VITAMIN D-3) 1000 UNITS CAPS Take 1 capsule by mouth daily.    [provider]  Coenzyme Q10 (CO Q 10) 100 MG CAPS Take 1 capsule by mouth daily.    [provider]  glucosamine-chondroitin 500-400 MG tablet Take 2 tablets by mouth daily.    [provider]  Multiple Vitamins-Minerals (HAIR SKIN AND NAILS FORMULA PO) Take 1 tablet by mouth daily.    [provider]  Potassium 99 MG TABS Take 1 tablet by mouth  daily.    [provider]  Probiotic Product (PROBIOTIC PO) Take 1 capsule by mouth daily.    [provider]    Allergies as of 07/01/2017 - Review Complete 02/22/2017  Allergen Reaction Noted  . Iodine Swelling 09/07/2013    Family History  Problem Relation Age of Onset  . Heart disease Father   . Stroke Father   . Parkinson's disease Father   . Dementia Mother   . Aortic aneurysm Mother   . Cancer Neg Hx     Social History   Socioeconomic History  . Marital status: Married    Spouse name: Not on file  . Number of children: Not on file  . Years of education: Not on file  . Highest education level: Not on file  Occupational History  . Not on file  Social Needs  . Financial resource strain: Not on file  . Food insecurity:    Worry: Not on file    Inability: Not on file  . Transportation needs:    Medical: Not on file    Non-medical: Not on file  Tobacco Use  . Smoking status: Never Smoker  . Smokeless tobacco: Never Used  Substance and Sexual Activity  . Alcohol use: Yes    Alcohol/week: 0.6 oz    Types: 1 Glasses of wine per week  Comment: 3 nocs ago  . Drug use: No  . Sexual activity: Yes  Lifestyle  . Physical activity:    Days per week: Not on file    Minutes per session: Not on file  . Stress: Not on file  Relationships  . Social connections:    Talks on phone: Not on file    Gets together: Not on file    Attends religious service: Not on file    Active member of club or organization: Not on file    Attends meetings of clubs or organizations: Not on file    Relationship status: Not on file  . Intimate partner violence:    Fear of current or ex partner: Not on file    Emotionally abused: Not on file    Physically abused: Not on file    Forced sexual activity: Not on file  Other Topics Concern  . Not on file  Social History Narrative  . Not on file    Review of Systems: See HPI, otherwise negative ROS  Physical  Exam: There were no vitals taken for this visit. General:   Alert,  pleasant and cooperative in NAD Head:  Normocephalic and atraumatic. Neck:  Supple; no masses or thyromegaly. Lungs:  Clear throughout to auscultation.    Heart:  Regular rate and rhythm. Abdomen:  Soft, nontender and nondistended. Normal bowel sounds, without guarding, and without rebound.   Neurologic:  Alert and  oriented x4;  grossly normal neurologically.  Impression/Plan: Kaitlyn Roman is here for an colonoscopy to be performed for [facility] colon polyps,  Risks, benefits, limitations, and alternatives regarding  colonoscopy have been reviewed with the patient.  Questions have been answered.  All parties agreeable.   Gaylyn Cheers, MD  09/09/2017, 12:20 PM

## 2017-09-09 NOTE — Op Note (Signed)
[facility] Patient Name: Kaitlyn Roman Procedure Date: 09/09/2017 12:18 PM MRN: [id number] Account #: [id number] Date of Birth: June 26, 1945 Admit Type: Outpatient Age: 72 Room: [facility] ENDO ROOM 1 Gender: Female Note Status: Finalized Procedure:            Colonoscopy Indications:          High risk colon cancer surveillance: Personal history                        of colonic polyps Providers:            Manya Silvas, MD Medicines:            Propofol per Anesthesia Complications:        No immediate complications. Procedure:            Pre-Anesthesia Assessment:                       - After reviewing the risks and benefits, the patient                        was deemed in satisfactory condition to undergo the                        procedure.                       After obtaining informed consent, the colonoscope was                        passed under direct vision. Throughout the procedure,                        the patient's blood pressure, pulse, and oxygen                        saturations were monitored continuously. The                        Colonoscope was introduced through the anus and                        advanced to the the cecum, identified by appendiceal                        orifice and ileocecal valve. The colonoscopy was                        performed without difficulty. The patient tolerated the                        procedure well. The quality of the bowel preparation                        was excellent. Findings:      A small polyp was found in the sigmoid colon. The polyp was sessile. The       polyp was removed with a hot snare. Resection and retrieval were       complete.      Internal hemorrhoids were found during endoscopy. The hemorrhoids were       small and Grade I (internal hemorrhoids  that do not prolapse).      The colon was rather long and redundant.      The exam was otherwise without  abnormality. Impression:           - One small polyp in the sigmoid colon, removed with a                        hot snare. Resected and retrieved.                       - Internal hemorrhoids.                       - The examination was otherwise normal. Recommendation:       - Await pathology results. Manya Silvas, MD 09/09/2017 12:52:55 PM This report has been signed electronically. Number of Addenda: 0 Note Initiated On: 09/09/2017 12:18 PM Scope Withdrawal Time: 0 hours 9 minutes 21 seconds  Total Procedure Duration: 0 hours 20 minutes 29 seconds       [facility]

## 2017-09-09 NOTE — Anesthesia Post-op Follow-up Note (Signed)
Anesthesia QCDR form completed.        

## 2017-09-09 NOTE — Anesthesia Procedure Notes (Signed)
Performed by: Arisbeth Purrington, CRNA Pre-anesthesia Checklist: Patient identified, Emergency Drugs available, Suction available, Patient being monitored and Timeout performed Patient Re-evaluated:Patient Re-evaluated prior to induction Oxygen Delivery Method: Nasal cannula Induction Type: IV induction       

## 2017-09-09 NOTE — Transfer of Care (Signed)
Immediate Anesthesia Transfer of Care Note  Patient: Kaitlyn Roman  Procedure(s) Performed: COLONOSCOPY WITH PROPOFOL (N/A )  Patient Location: PACU  Anesthesia Type:General  Level of Consciousness: sedated  Airway & Oxygen Therapy: Patient Spontanous Breathing and Patient connected to nasal cannula oxygen  Post-op Assessment: Report given to RN and Post -op Vital signs reviewed and stable  Post vital signs: Reviewed and stable  Last Vitals:  Vitals Value Taken Time  BP 91/61 09/09/2017 12:54 PM  Temp 36.1 C 09/09/2017 12:53 PM  Pulse 86 09/09/2017 12:54 PM  Resp 13 09/09/2017 12:54 PM  SpO2 98 % 09/09/2017 12:54 PM    Last Pain: There were no vitals filed for this visit.       Complications: No apparent anesthesia complications

## 2017-09-09 NOTE — Anesthesia Postprocedure Evaluation (Signed)
Anesthesia Post Note  Patient: Kaitlyn Roman  Procedure(s) Performed: COLONOSCOPY WITH PROPOFOL (N/A )  Patient location during evaluation: Endoscopy Anesthesia Type: General Level of consciousness: awake and alert Pain management: pain level controlled Vital Signs Assessment: post-procedure vital signs reviewed and stable Respiratory status: spontaneous breathing, nonlabored ventilation, respiratory function stable and patient connected to nasal cannula oxygen Cardiovascular status: blood pressure returned to baseline and stable Postop Assessment: no apparent nausea or vomiting Anesthetic complications: no     Last Vitals:  Vitals:   09/09/17 1253 09/09/17 1254  BP: 91/61 91/61  Pulse: 86 86  Resp: 14 13  Temp: (!) 36.1 C   SpO2: 98% 98%    Last Pain: There were no vitals filed for this visit.               Abel Hageman S

## 2017-09-10 LAB — SURGICAL PATHOLOGY

## 2017-09-11 ENCOUNTER — Encounter: Payer: Self-pay | Admitting: Unknown Physician Specialty

## 2017-10-03 DIAGNOSIS — M1712 Unilateral primary osteoarthritis, left knee: Secondary | ICD-10-CM | POA: Diagnosis not present

## 2017-11-24 ENCOUNTER — Other Ambulatory Visit: Payer: Self-pay | Admitting: Internal Medicine

## 2017-11-24 DIAGNOSIS — I10 Essential (primary) hypertension: Secondary | ICD-10-CM

## 2017-11-27 DIAGNOSIS — Z9841 Cataract extraction status, right eye: Secondary | ICD-10-CM | POA: Diagnosis not present

## 2017-11-27 DIAGNOSIS — Z9842 Cataract extraction status, left eye: Secondary | ICD-10-CM | POA: Diagnosis not present

## 2017-11-27 DIAGNOSIS — H52223 Regular astigmatism, bilateral: Secondary | ICD-10-CM | POA: Diagnosis not present

## 2017-12-11 DIAGNOSIS — R69 Illness, unspecified: Secondary | ICD-10-CM | POA: Diagnosis not present

## 2018-01-13 DIAGNOSIS — E7800 Pure hypercholesterolemia, unspecified: Secondary | ICD-10-CM | POA: Diagnosis not present

## 2018-01-13 DIAGNOSIS — M150 Primary generalized (osteo)arthritis: Secondary | ICD-10-CM | POA: Diagnosis not present

## 2018-01-13 DIAGNOSIS — D125 Benign neoplasm of sigmoid colon: Secondary | ICD-10-CM | POA: Diagnosis not present

## 2018-01-13 DIAGNOSIS — I10 Essential (primary) hypertension: Secondary | ICD-10-CM | POA: Diagnosis not present

## 2018-01-13 DIAGNOSIS — Z13820 Encounter for screening for osteoporosis: Secondary | ICD-10-CM | POA: Diagnosis not present

## 2018-01-13 DIAGNOSIS — Z0000 Encounter for general adult medical examination without abnormal findings: Secondary | ICD-10-CM | POA: Diagnosis not present

## 2018-02-24 DIAGNOSIS — M150 Primary generalized (osteo)arthritis: Secondary | ICD-10-CM | POA: Diagnosis not present

## 2018-02-24 DIAGNOSIS — I10 Essential (primary) hypertension: Secondary | ICD-10-CM | POA: Diagnosis not present

## 2018-02-24 DIAGNOSIS — Z13820 Encounter for screening for osteoporosis: Secondary | ICD-10-CM | POA: Diagnosis not present

## 2018-02-24 DIAGNOSIS — Z23 Encounter for immunization: Secondary | ICD-10-CM | POA: Diagnosis not present

## 2018-03-12 DIAGNOSIS — L710 Perioral dermatitis: Secondary | ICD-10-CM | POA: Diagnosis not present

## 2018-03-12 DIAGNOSIS — D225 Melanocytic nevi of trunk: Secondary | ICD-10-CM | POA: Diagnosis not present

## 2018-03-12 DIAGNOSIS — D2262 Melanocytic nevi of left upper limb, including shoulder: Secondary | ICD-10-CM | POA: Diagnosis not present

## 2018-03-12 DIAGNOSIS — D2271 Melanocytic nevi of right lower limb, including hip: Secondary | ICD-10-CM | POA: Diagnosis not present

## 2018-03-12 DIAGNOSIS — D2261 Melanocytic nevi of right upper limb, including shoulder: Secondary | ICD-10-CM | POA: Diagnosis not present

## 2018-03-12 DIAGNOSIS — L821 Other seborrheic keratosis: Secondary | ICD-10-CM | POA: Diagnosis not present

## 2018-03-12 DIAGNOSIS — D2272 Melanocytic nevi of left lower limb, including hip: Secondary | ICD-10-CM | POA: Diagnosis not present

## 2018-07-09 DIAGNOSIS — M150 Primary generalized (osteo)arthritis: Secondary | ICD-10-CM | POA: Diagnosis not present

## 2018-07-09 DIAGNOSIS — J069 Acute upper respiratory infection, unspecified: Secondary | ICD-10-CM | POA: Diagnosis not present

## 2018-07-09 DIAGNOSIS — K5900 Constipation, unspecified: Secondary | ICD-10-CM | POA: Diagnosis not present

## 2018-07-09 DIAGNOSIS — I10 Essential (primary) hypertension: Secondary | ICD-10-CM | POA: Diagnosis not present

## 2018-08-26 DIAGNOSIS — I7090 Unspecified atherosclerosis: Secondary | ICD-10-CM | POA: Diagnosis not present

## 2018-08-26 DIAGNOSIS — Z823 Family history of stroke: Secondary | ICD-10-CM | POA: Diagnosis not present

## 2018-09-15 DIAGNOSIS — E041 Nontoxic single thyroid nodule: Secondary | ICD-10-CM | POA: Diagnosis not present

## 2018-10-01 DIAGNOSIS — E041 Nontoxic single thyroid nodule: Secondary | ICD-10-CM | POA: Diagnosis not present

## 2018-10-01 DIAGNOSIS — I10 Essential (primary) hypertension: Secondary | ICD-10-CM | POA: Diagnosis not present

## 2018-10-06 DIAGNOSIS — E041 Nontoxic single thyroid nodule: Secondary | ICD-10-CM | POA: Diagnosis not present

## 2018-10-06 DIAGNOSIS — E042 Nontoxic multinodular goiter: Secondary | ICD-10-CM | POA: Diagnosis not present

## 2018-12-15 DIAGNOSIS — M353 Polymyalgia rheumatica: Secondary | ICD-10-CM | POA: Diagnosis not present

## 2018-12-25 ENCOUNTER — Other Ambulatory Visit: Payer: Self-pay

## 2019-01-12 DIAGNOSIS — I10 Essential (primary) hypertension: Secondary | ICD-10-CM | POA: Diagnosis not present

## 2019-01-12 DIAGNOSIS — M353 Polymyalgia rheumatica: Secondary | ICD-10-CM | POA: Diagnosis not present

## 2019-01-12 DIAGNOSIS — E041 Nontoxic single thyroid nodule: Secondary | ICD-10-CM | POA: Diagnosis not present

## 2019-01-12 DIAGNOSIS — E7800 Pure hypercholesterolemia, unspecified: Secondary | ICD-10-CM | POA: Diagnosis not present

## 2019-01-26 DIAGNOSIS — Z9842 Cataract extraction status, left eye: Secondary | ICD-10-CM | POA: Diagnosis not present

## 2019-01-26 DIAGNOSIS — H52223 Regular astigmatism, bilateral: Secondary | ICD-10-CM | POA: Diagnosis not present

## 2019-01-26 DIAGNOSIS — Z9841 Cataract extraction status, right eye: Secondary | ICD-10-CM | POA: Diagnosis not present

## 2019-01-28 DIAGNOSIS — Z1231 Encounter for screening mammogram for malignant neoplasm of breast: Secondary | ICD-10-CM | POA: Diagnosis not present

## 2019-01-30 DIAGNOSIS — Z23 Encounter for immunization: Secondary | ICD-10-CM | POA: Diagnosis not present

## 2019-02-13 DIAGNOSIS — R928 Other abnormal and inconclusive findings on diagnostic imaging of breast: Secondary | ICD-10-CM | POA: Diagnosis not present

## 2019-02-13 DIAGNOSIS — R922 Inconclusive mammogram: Secondary | ICD-10-CM | POA: Diagnosis not present

## 2019-04-20 DIAGNOSIS — I10 Essential (primary) hypertension: Secondary | ICD-10-CM | POA: Diagnosis not present

## 2019-04-20 DIAGNOSIS — M150 Primary generalized (osteo)arthritis: Secondary | ICD-10-CM | POA: Diagnosis not present

## 2019-04-22 DIAGNOSIS — Z20828 Contact with and (suspected) exposure to other viral communicable diseases: Secondary | ICD-10-CM | POA: Diagnosis not present

## 2019-05-06 DIAGNOSIS — N281 Cyst of kidney, acquired: Secondary | ICD-10-CM | POA: Diagnosis not present

## 2019-05-29 DIAGNOSIS — I219 Acute myocardial infarction, unspecified: Secondary | ICD-10-CM

## 2019-05-29 HISTORY — DX: Acute myocardial infarction, unspecified: I21.9

## 2019-05-29 HISTORY — PX: CARDIAC CATHETERIZATION: SHX172

## 2019-08-31 ENCOUNTER — Ambulatory Visit: Payer: Medicare HMO

## 2021-05-07 DIAGNOSIS — I77810 Thoracic aortic ectasia: Secondary | ICD-10-CM | POA: Insufficient documentation

## 2021-05-07 DIAGNOSIS — I5189 Other ill-defined heart diseases: Secondary | ICD-10-CM | POA: Insufficient documentation

## 2021-06-05 ENCOUNTER — Encounter: Payer: Medicare HMO | Attending: Internal Medicine | Admitting: *Deleted

## 2021-06-05 ENCOUNTER — Encounter: Payer: Self-pay | Admitting: *Deleted

## 2021-06-05 ENCOUNTER — Other Ambulatory Visit: Payer: Self-pay

## 2021-06-05 DIAGNOSIS — Z955 Presence of coronary angioplasty implant and graft: Secondary | ICD-10-CM | POA: Insufficient documentation

## 2021-06-05 DIAGNOSIS — I214 Non-ST elevation (NSTEMI) myocardial infarction: Secondary | ICD-10-CM | POA: Insufficient documentation

## 2021-06-05 DIAGNOSIS — E785 Hyperlipidemia, unspecified: Secondary | ICD-10-CM | POA: Insufficient documentation

## 2021-06-05 NOTE — Progress Notes (Signed)
Initial telephone orientation completed. Diagnosis can be found in [facility] 11/30. EP orientation scheduled for Wednesday 1/18 at 10:30.

## 2021-06-14 ENCOUNTER — Other Ambulatory Visit: Payer: Self-pay

## 2021-06-14 VITALS — Ht 61.2 in | Wt 123.6 lb

## 2021-06-14 DIAGNOSIS — I214 Non-ST elevation (NSTEMI) myocardial infarction: Secondary | ICD-10-CM

## 2021-06-14 DIAGNOSIS — Z955 Presence of coronary angioplasty implant and graft: Secondary | ICD-10-CM | POA: Diagnosis not present

## 2021-06-14 NOTE — Progress Notes (Signed)
Cardiac Individual Treatment Plan  Patient Details  Name: Kaitlyn Roman MRN: [id number] Date of Birth: [birth year] Referring Provider:   Flowsheet Row Cardiac Rehab from 06/14/2021 in [facility] Cardiac and Pulmonary Rehab  Referring Provider Estell Harpin MD       Initial Encounter Date:  Flowsheet Row Cardiac Rehab from 06/14/2021 in [facility] Cardiac and Pulmonary Rehab  Date 06/14/21       Visit Diagnosis: NSTEMI (non-ST elevated myocardial infarction) [facility])  Status post coronary artery stent placement  Patient's Home Medications on Admission:  Current Outpatient Medications:    acetaminophen (TYLENOL) 650 MG CR tablet, Take by mouth., Disp: , Rfl:    aspirin 81 MG tablet, Take 81 mg by mouth daily., Disp: , Rfl:    atorvastatin (LIPITOR) 40 MG tablet, take 1 tablet by mouth once daily (Patient not taking: Reported on 06/05/2021), Disp: 90 tablet, Rfl: 1   Cholecalciferol (VITAMIN D-3) 1000 UNITS CAPS, Take 1 capsule by mouth daily., Disp: , Rfl:    clopidogrel (PLAVIX) 75 MG tablet, Take 75 mg by mouth daily., Disp: , Rfl:    Coenzyme Q10 (CO Q 10) 100 MG CAPS, Take 1 capsule by mouth daily. (Patient not taking: Reported on 06/05/2021), Disp: , Rfl:    diphenhydrAMINE (BENADRYL) 25 mg capsule, Take by mouth., Disp: , Rfl:    doxylamine, Sleep, (UNISOM) 25 MG tablet, Take 12.5 mg by mouth at bedtime as needed for sleep.  (Patient not taking: Reported on 06/05/2021), Disp: , Rfl:    fluticasone (FLONASE) 50 MCG/ACT nasal spray, Place 2 sprays into both nostrils daily. (Patient not taking: Reported on 06/05/2021), Disp: 16 g, Rfl: 6   glucosamine-chondroitin 500-400 MG tablet, Take 2 tablets by mouth daily. (Patient not taking: Reported on 06/05/2021), Disp: , Rfl:    losartan (COZAAR) 25 MG tablet, Take 25 mg by mouth daily., Disp: , Rfl:    losartan-hydrochlorothiazide (HYZAAR) 100-12.5 MG tablet, Take 1 tablet by mouth daily. (Patient not taking: Reported on 06/05/2021), Disp: 90 tablet, Rfl: 1    meloxicam (MOBIC) 15 MG tablet, Take 1 tablet (15 mg total) by mouth daily. (Patient not taking: Reported on 06/05/2021), Disp: 90 tablet, Rfl: 1   metoprolol succinate (TOPROL-XL) 25 MG 24 hr tablet, Take 25 mg by mouth daily., Disp: , Rfl:    Multiple Vitamins-Minerals (HAIR SKIN AND NAILS FORMULA PO), Take 1 tablet by mouth daily. (Patient not taking: Reported on 06/05/2021), Disp: , Rfl:    nitroGLYCERIN (NITROSTAT) 0.4 MG SL tablet, Place under the tongue., Disp: , Rfl:    Potassium 99 MG TABS, Take 1 tablet by mouth daily., Disp: , Rfl:    Probiotic Product (PROBIOTIC PO), Take 1 capsule by mouth daily. (Patient not taking: Reported on 06/05/2021), Disp: , Rfl:    rosuvastatin (CRESTOR) 20 MG tablet, Take 20 mg by mouth at bedtime., Disp: , Rfl:   Past Medical History: Past Medical History:  Diagnosis Date   Arthritis    Brain tumor ([place])    Chronic kidney disease    renal cysts   Colitis, infectious April 2013    C dificile colitis, hosp [facility] April 2013 x 2 weeks   Hyperlipidemia    Hypertension    Polymyalgia rheumatica syndrome ([place])    [facility]oloigst [facility] [place] Use: Social History   Tobacco Use  Smoking Status Never  Smokeless Tobacco Never    Labs: Recent Review Flowsheet Data     Labs for ITP Cardiac  and Pulmonary Rehab Latest Ref Rng & Units 09/22/2014 12/20/2015 06/12/2016 08/17/2016 02/22/2017   Cholestrol 0 - 200 mg/dL 190 - - 250(H) 224(H)   LDLCALC 0 - 99 mg/dL 97 - - 146(H) 124(H)   LDLDIRECT mg/dL - 104.0 125.0 - -   HDL >39.00 mg/dL 72.40 - - 82.90 81.90   Trlycerides 0.0 - 149.0 mg/dL 103.0 - - 106.0 92.0        Exercise Target Goals: Exercise Program Goal: Individual exercise prescription set using results from initial 6 min walk test and THRR while considering  patients activity barriers and safety.   Exercise Prescription Goal: Initial exercise prescription builds to 30-45 minutes a day of aerobic activity, 2-3 days per  week.  Home exercise guidelines will be given to patient during program as part of exercise prescription that the participant will acknowledge.   Education: Aerobic Exercise: - Group verbal and visual presentation on the components of exercise prescription. Introduces F.I.T.T principle from ACSM for exercise prescriptions.  Reviews F.I.T.T. principles of aerobic exercise including progression. Written material given at graduation.   Education: Resistance Exercise: - Group verbal and visual presentation on the components of exercise prescription. Introduces F.I.T.T principle from ACSM for exercise prescriptions  Reviews F.I.T.T. principles of resistance exercise including progression. Written material given at graduation.    Education: Exercise & Equipment Safety: - Individual verbal instruction and demonstration of equipment use and safety with use of the equipment. Flowsheet Row Cardiac Rehab from 06/14/2021 in [facility] Cardiac and Pulmonary Rehab  Education need identified 06/14/21  Date 06/14/21  Educator [place]  Instruction Review Code 1- Verbalizes Understanding       Education: Exercise Physiology & General Exercise Guidelines: - Group verbal and written instruction with models to review the exercise physiology of the cardiovascular system and associated critical values. Provides general exercise guidelines with specific guidelines to those with heart or lung disease.  Flowsheet Row Cardiac Rehab from 06/14/2021 in [facility] Cardiac and Pulmonary Rehab  Education need identified 06/14/21       Education: Flexibility, Balance, Mind/Body Relaxation: - Group verbal and visual presentation with interactive activity on the components of exercise prescription. Introduces F.I.T.T principle from ACSM for exercise prescriptions. Reviews F.I.T.T. principles of flexibility and balance exercise training including progression. Also discusses the mind body connection.  Reviews various relaxation techniques  to help reduce and manage stress (i.e. Deep breathing, progressive muscle relaxation, and visualization). Balance handout provided to take home. Written material given at graduation.   Activity Barriers & Risk Stratification:  Activity Barriers & Cardiac Risk Stratification - 06/14/21 1156       Activity Barriers & Cardiac Risk Stratification   Activity Barriers Arthritis;Balance Concerns    Cardiac Risk Stratification Moderate             6 Minute Walk:  6 Minute Walk     Row Name 06/14/21 1157         6 Minute Walk   Phase Initial     Distance 1180 feet     Walk Time 6 minutes     # of Rest Breaks 0     MPH 2.23     METS 2.12     RPE 7     Perceived Dyspnea  0     VO2 Peak 7.43     Symptoms No     Resting HR 58 bpm     Resting BP 122/70     Resting Oxygen Saturation  96 %  Exercise Oxygen Saturation  during 6 min walk 99 %     Max Ex. HR 71 bpm     Max Ex. BP 132/72     2 Minute Post BP 112/68              Oxygen Initial Assessment:   Oxygen Re-Evaluation:   Oxygen Discharge (Final Oxygen Re-Evaluation):   Initial Exercise Prescription:  Initial Exercise Prescription - 06/14/21 1200       Date of Initial Exercise RX and Referring Provider   Date 06/14/21    Referring Provider Estell Harpin MD      Oxygen   Maintain Oxygen Saturation 88% or higher      Treadmill   MPH 2.3    Grade 1    Minutes 15    METs 3.08      NuStep   Level 3    SPM 80    Minutes 15    METs 2.12      Elliptical   Level 1    Speed 2.5    Minutes 15    METs 2.1      Prescription Details   Frequency (times per week) 2    Duration Progress to 30 minutes of continuous aerobic without signs/symptoms of physical distress      Intensity   THRR 40-80% of Max Heartrate 92-126    Ratings of Perceived Exertion 11-13    Perceived Dyspnea 0-4      Progression   Progression Continue to progress workloads to maintain intensity without signs/symptoms of physical  distress.      Resistance Training   Training Prescription Yes    Weight 3 lb    Reps 10-15             Perform Capillary Blood Glucose checks as needed.  Exercise Prescription Changes:   Exercise Prescription Changes     Row Name 06/14/21 1200             Response to Exercise   Blood Pressure (Admit) 122/70       Blood Pressure (Exercise) 132/72       Blood Pressure (Exit) 112/68       Heart Rate (Admit) 58 bpm       Heart Rate (Exercise) 71 bpm       Heart Rate (Exit) 56 bpm       Oxygen Saturation (Admit) 96 %       Oxygen Saturation (Exercise) 99 %       Rating of Perceived Exertion (Exercise) 7       Perceived Dyspnea (Exercise) 0       Symptoms none       Comments walk test results                Exercise Comments:   Exercise Goals and Review:   Exercise Goals     Row Name 06/14/21 1221             Exercise Goals   Increase Physical Activity Yes       Intervention Provide advice, education, support and counseling about physical activity/exercise needs.;Develop an individualized exercise prescription for aerobic and resistive training based on initial evaluation findings, risk stratification, comorbidities and participant's personal goals.       Expected Outcomes Short Term: Attend rehab on a regular basis to increase amount of physical activity.;Long Term: Add in home exercise to make exercise part of routine and to increase amount of physical activity.;Long Term: Exercising  regularly at least 3-5 days a week.       Increase Strength and Stamina Yes       Intervention Provide advice, education, support and counseling about physical activity/exercise needs.;Develop an individualized exercise prescription for aerobic and resistive training based on initial evaluation findings, risk stratification, comorbidities and participant's personal goals.       Expected Outcomes Short Term: Increase workloads from initial exercise prescription for resistance,  speed, and METs.;Short Term: Perform resistance training exercises routinely during rehab and add in resistance training at home;Long Term: Improve cardiorespiratory fitness, muscular endurance and strength as measured by increased METs and functional capacity (6MWT)       Able to understand and use rate of perceived exertion (RPE) scale Yes       Intervention Provide education and explanation on how to use RPE scale       Expected Outcomes Short Term: Able to use RPE daily in rehab to express subjective intensity level;Long Term:  Able to use RPE to guide intensity level when exercising independently       Able to understand and use Dyspnea scale Yes       Intervention Provide education and explanation on how to use Dyspnea scale       Expected Outcomes Short Term: Able to use Dyspnea scale daily in rehab to express subjective sense of shortness of breath during exertion;Long Term: Able to use Dyspnea scale to guide intensity level when exercising independently       Knowledge and understanding of Target Heart Rate Range (THRR) Yes       Intervention Provide education and explanation of THRR including how the numbers were predicted and where they are located for reference       Expected Outcomes Short Term: Able to state/look up THRR;Long Term: Able to use THRR to govern intensity when exercising independently;Short Term: Able to use daily as guideline for intensity in rehab       Able to check pulse independently Yes       Intervention Provide education and demonstration on how to check pulse in carotid and radial arteries.;Review the importance of being able to check your own pulse for safety during independent exercise       Expected Outcomes Short Term: Able to explain why pulse checking is important during independent exercise;Long Term: Able to check pulse independently and accurately       Understanding of Exercise Prescription Yes       Intervention Provide education, explanation, and written  materials on patient's individual exercise prescription       Expected Outcomes Short Term: Able to explain program exercise prescription;Long Term: Able to explain home exercise prescription to exercise independently                Exercise Goals Re-Evaluation :   Discharge Exercise Prescription (Final Exercise Prescription Changes):  Exercise Prescription Changes - 06/14/21 1200       Response to Exercise   Blood Pressure (Admit) 122/70    Blood Pressure (Exercise) 132/72    Blood Pressure (Exit) 112/68    Heart Rate (Admit) 58 bpm    Heart Rate (Exercise) 71 bpm    Heart Rate (Exit) 56 bpm    Oxygen Saturation (Admit) 96 %    Oxygen Saturation (Exercise) 99 %    Rating of Perceived Exertion (Exercise) 7    Perceived Dyspnea (Exercise) 0    Symptoms none    Comments walk test results  Nutrition:  Target Goals: Understanding of nutrition guidelines, daily intake of sodium 1500mg , cholesterol 200mg , calories 30% from fat and 7% or less from saturated fats, daily to have 5 or more servings of fruits and vegetables.  Education: All About Nutrition: -Group instruction provided by verbal, written material, interactive activities, discussions, models, and posters to present general guidelines for heart healthy nutrition including fat, fiber, MyPlate, the role of sodium in heart healthy nutrition, utilization of the nutrition label, and utilization of this knowledge for meal planning. Follow up email sent as well. Written material given at graduation. Flowsheet Row Cardiac Rehab from 06/14/2021 in [facility] Cardiac and Pulmonary Rehab  Education need identified 06/14/21       Biometrics:  Pre Biometrics - 06/14/21 1156       Pre Biometrics   Height 5' 1.2" (1.554 m)    Weight 123 lb 9.6 oz (56.1 kg)    BMI (Calculated) 23.22    Single Leg Stand 7.19 seconds              Nutrition Therapy Plan and Nutrition Goals:  Nutrition Therapy & Goals - 06/14/21  1153       Intervention Plan   Intervention Prescribe, educate and counsel regarding individualized specific dietary modifications aiming towards targeted core components such as weight, hypertension, lipid management, diabetes, heart failure and other comorbidities.    Expected Outcomes Short Term Goal: Understand basic principles of dietary content, such as calories, fat, sodium, cholesterol and nutrients.;Short Term Goal: A plan has been developed with personal nutrition goals set during dietitian appointment.;Long Term Goal: Adherence to prescribed nutrition plan.             Nutrition Assessments:  MEDIFICTS Score Key: ?70 Need to make dietary changes  40-70 Heart Healthy Diet ? 40 Therapeutic Level Cholesterol Diet  Flowsheet Row Cardiac Rehab from 06/14/2021 in [facility] Cardiac and Pulmonary Rehab  Picture Your Plate Total Score on Admission 61      Picture Your Plate Scores: <65 Unhealthy dietary pattern with much room for improvement. 41-50 Dietary pattern unlikely to meet recommendations for good health and room for improvement. 51-60 More healthful dietary pattern, with some room for improvement.  >60 Healthy dietary pattern, although there may be some specific behaviors that could be improved.    Nutrition Goals Re-Evaluation:   Nutrition Goals Discharge (Final Nutrition Goals Re-Evaluation):   Psychosocial: Target Goals: Acknowledge presence or absence of significant depression and/or stress, maximize coping skills, provide positive support system. Participant is able to verbalize types and ability to use techniques and skills needed for reducing stress and depression.   Education: Stress, Anxiety, and Depression - Group verbal and visual presentation to define topics covered.  Reviews how body is impacted by stress, anxiety, and depression.  Also discusses healthy ways to reduce stress and to treat/manage anxiety and depression.  Written material given at  graduation.   Education: Sleep Hygiene -Provides group verbal and written instruction about how sleep can affect your health.  Define sleep hygiene, discuss sleep cycles and impact of sleep habits. Review good sleep hygiene tips.    Initial Review & Psychosocial Screening:  Initial Psych Review & Screening - 06/05/21 1419       Initial Review   Current issues with None Identified      Family Dynamics   Good Support System? Yes   husband     Barriers   Psychosocial barriers to participate in program There are no identifiable barriers or psychosocial needs.;The  patient should benefit from training in stress management and relaxation.      Screening Interventions   Interventions Provide feedback about the scores to participant;Encouraged to exercise;To provide support and resources with identified psychosocial needs    Expected Outcomes Short Term goal: Utilizing psychosocial counselor, staff and physician to assist with identification of specific Stressors or current issues interfering with healing process. Setting desired goal for each stressor or current issue identified.;Long Term Goal: Stressors or current issues are controlled or eliminated.;Short Term goal: Identification and review with participant of any Quality of Life or Depression concerns found by scoring the questionnaire.;Long Term goal: The participant improves quality of Life and PHQ9 Scores as seen by post scores and/or verbalization of changes             Quality of Life Scores:   Quality of Life - 06/14/21 1153       Quality of Life   Select Quality of Life      Quality of Life Scores   Health/Function Pre 28.71 %    Socioeconomic Pre 28.75 %    Psych/Spiritual Pre 30 %    Family Pre 30 %    GLOBAL Pre 29.2 %            Scores of 19 and below usually indicate a poorer quality of life in these areas.  A difference of  2-3 points is a clinically meaningful difference.  A difference of 2-3 points in the  total score of the Quality of Life Index has been associated with significant improvement in overall quality of life, self-image, physical symptoms, and general health in studies assessing change in quality of life.  PHQ-9: Recent Review Flowsheet Data     Depression screen [facility] 2/9 06/14/2021 06/06/2016 09/22/2014 02/24/2013   Decreased Interest 0 0 0 0   Down, Depressed, Hopeless 0 0 0 0   PHQ - 2 Score 0 0 0 0   Altered sleeping 0 - - -   Tired, decreased energy 1 - - -   Change in appetite 0 - - -   Feeling bad or failure about yourself  0 - - -   Trouble concentrating 0 - - -   Moving slowly or fidgety/restless 0 - - -   Suicidal thoughts 0 - - -   PHQ-9 Score 1 - - -   Difficult doing work/chores Not difficult at all - - -      Interpretation of Total Score  Total Score Depression Severity:  1-4 = Minimal depression, 5-9 = Mild depression, 10-14 = Moderate depression, 15-19 = Moderately severe depression, 20-27 = Severe depression   Psychosocial Evaluation and Intervention:  Psychosocial Evaluation - 06/05/21 1423       Psychosocial Evaluation & Interventions   Interventions Encouraged to exercise with the program and follow exercise prescription    Comments Shantika reports doing well post MI and stent. She does state her stamina is still sluggish. Her husband helps manage her health care and plans to be involved. She does not report any current stressors. 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 She does have a $45 copay so she is planning on doing the program short term. She is already exercising on her own and is excited to learn more about heart healthy living.    Expected Outcomes  Short: attend cardiac rehab for education and exercise. Long: develop and maintain positive self care habits.    Continue Psychosocial Services  Follow up required by staff              Psychosocial Re-Evaluation:   Psychosocial Discharge (Final Psychosocial Re-Evaluation):   Vocational Rehabilitation: Provide vocational rehab assistance to qualifying candidates.   Vocational Rehab Evaluation & Intervention:  Vocational Rehab - 06/05/21 1419       Initial Vocational Rehab Evaluation & Intervention   Assessment shows need for Vocational Rehabilitation No             Education: Education Goals: Education classes will be provided on a variety of topics geared toward better understanding of heart health and risk factor modification. Participant will state understanding/return demonstration of topics presented as noted by education test scores.  Learning Barriers/Preferences:  Learning Barriers/Preferences - 06/05/21 1409       Learning Barriers/Preferences   Learning Barriers Hearing   deaf in left ear   Learning Preferences None             General Cardiac Education Topics:  AED/CPR: - Group verbal and written instruction with the use of models to demonstrate the basic use of the AED with the basic ABC's of resuscitation.   Anatomy and Cardiac Procedures: - Group verbal and visual presentation and models provide information about basic cardiac anatomy and function. Reviews the testing methods done to diagnose heart disease and the outcomes of the test results. Describes the treatment choices: Medical Management, Angioplasty, or Coronary Bypass Surgery for treating various heart conditions including Myocardial Infarction, Angina, Valve Disease, and Cardiac Arrhythmias.  Written material given at graduation.   Medication Safety: - Group verbal and visual instruction to review commonly prescribed medications for heart and lung disease. Reviews the medication, class of the drug, and side effects. Includes the steps to properly store meds and maintain the prescription regimen.  Written material given at graduation.   Intimacy: - Group  verbal instruction through game format to discuss how heart and lung disease can affect sexual intimacy. Written material given at graduation..   Know Your Numbers and Heart Failure: - Group verbal and visual instruction to discuss disease risk factors for cardiac and pulmonary disease and treatment options.  Reviews associated critical values for Overweight/Obesity, Hypertension, Cholesterol, and Diabetes.  Discusses basics of heart failure: signs/symptoms and treatments.  Introduces Heart Failure Zone chart for action plan for heart failure.  Written material given at graduation.   Infection Prevention: - Provides verbal and written material to individual with discussion of infection control including proper hand washing and proper equipment cleaning during exercise session. Flowsheet Row Cardiac Rehab from 06/14/2021 in [facility] Cardiac and Pulmonary Rehab  Education need identified 06/14/21  Date 06/14/21  Educator [place]  Instruction Review Code 1- Verbalizes Understanding       Falls Prevention: - Provides verbal and written material to individual with discussion of falls prevention and safety. Flowsheet Row Cardiac Rehab from 06/14/2021 in [facility] Cardiac and Pulmonary Rehab  Education need identified 06/14/21  Date 06/14/21  Educator [place]  Instruction Review Code 1- Verbalizes Understanding       Other: -Provides group and verbal instruction on various topics (see comments)   Knowledge Questionnaire Score:  Knowledge Questionnaire Score - 06/14/21 1151       Knowledge Questionnaire Score   Pre Score 24/26: Nutrition, Exercise             Core Components/Risk  Factors/Patient Goals at Admission:  Personal Goals and Risk Factors at Admission - 06/14/21 1221       Core Components/Risk Factors/Patient Goals on Admission    Weight Management Yes;Weight Maintenance    Intervention Weight Management: Develop a combined nutrition and exercise program designed to reach desired  caloric intake, while maintaining appropriate intake of nutrient and fiber, sodium and fats, and appropriate energy expenditure required for the weight goal.;Weight Management: Provide education and appropriate resources to help participant work on and attain dietary goals.;Weight Management/Obesity: Establish reasonable short term and long term weight goals.    Admit Weight 123 lb (55.8 kg)    Goal Weight: Short Term 123 lb (55.8 kg)    Goal Weight: Long Term 123 lb (55.8 kg)    Expected Outcomes Short Term: Continue to assess and modify interventions until short term weight is achieved;Long Term: Adherence to nutrition and physical activity/exercise program aimed toward attainment of established weight goal;Weight Maintenance: Understanding of the daily nutrition guidelines, which includes 25-35% calories from fat, 7% or less cal from saturated fats, less than 200mg  cholesterol, less than 1.5gm of sodium, & 5 or more servings of fruits and vegetables daily;Understanding recommendations for meals to include 15-35% energy as protein, 25-35% energy from fat, 35-60% energy from carbohydrates, less than 200mg  of dietary cholesterol, 20-35 gm of total fiber daily;Understanding of distribution of calorie intake throughout the day with the consumption of 4-5 meals/snacks    Hypertension Yes    Intervention Provide education on lifestyle modifcations including regular physical activity/exercise, weight management, moderate sodium restriction and increased consumption of fresh fruit, vegetables, and low fat dairy, alcohol moderation, and smoking cessation.;Monitor prescription use compliance.    Expected Outcomes Short Term: Continued assessment and intervention until BP is < 140/9mm HG in hypertensive participants. < 130/9mm HG in hypertensive participants with diabetes, heart failure or chronic kidney disease.;Long Term: Maintenance of blood pressure at goal levels.    Lipids Yes    Intervention Provide  education and support for participant on nutrition & aerobic/resistive exercise along with prescribed medications to achieve LDL 70mg , HDL >40mg .    Expected Outcomes Short Term: Participant states understanding of desired cholesterol values and is compliant with medications prescribed. Participant is following exercise prescription and nutrition guidelines.;Long Term: Cholesterol controlled with medications as prescribed, with individualized exercise RX and with personalized nutrition plan. Value goals: LDL < 70mg , HDL > 40 mg.             Education:Diabetes - Individual verbal and written instruction to review signs/symptoms of diabetes, desired ranges of glucose level fasting, after meals and with exercise. Acknowledge that pre and post exercise glucose checks will be done for 3 sessions at entry of program.   Core Components/Risk Factors/Patient Goals Review:    Core Components/Risk Factors/Patient Goals at Discharge (Final Review):    ITP Comments:  ITP Comments     Row Name 06/05/21 1433 06/14/21 1150         ITP Comments Initial telephone orientation completed. Diagnosis can be found in [facility] 11/30. EP orientation scheduled for Wednesday 1/18 at 10:30. Completed 6MWT and gym orientation. Initial ITP created and sent for review to Dr. Emily Filbert,  Medical Director.               Comments: Initial ITP

## 2021-06-14 NOTE — Patient Instructions (Signed)
Patient Instructions  Patient Details  Name: Kaitlyn Roman MRN: [id number] Date of Birth: November 03, 1945 Referring Provider:  Clarnce Flock*  Below are your personal goals for exercise, nutrition, and risk factors. Our goal is to help you stay on track towards obtaining and maintaining these goals. We will be discussing your progress on these goals with you throughout the program.  Initial Exercise Prescription:  Initial Exercise Prescription - 06/14/21 1200       Date of Initial Exercise RX and Referring Provider   Date 06/14/21    Referring Provider Estell Harpin MD      Oxygen   Maintain Oxygen Saturation 88% or higher      Treadmill   MPH 2.3    Grade 1    Minutes 15    METs 3.08      NuStep   Level 3    SPM 80    Minutes 15    METs 2.12      Elliptical   Level 1    Speed 2.5    Minutes 15    METs 2.1      Prescription Details   Frequency (times per week) 2    Duration Progress to 30 minutes of continuous aerobic without signs/symptoms of physical distress      Intensity   THRR 40-80% of Max Heartrate 92-126    Ratings of Perceived Exertion 11-13    Perceived Dyspnea 0-4      Progression   Progression Continue to progress workloads to maintain intensity without signs/symptoms of physical distress.      Resistance Training   Training Prescription Yes    Weight 3 lb    Reps 10-15             Exercise Goals: Frequency: Be able to perform aerobic exercise two to three times per week in program working toward 2-5 days per week of home exercise.  Intensity: Work with a perceived exertion of 11 (fairly light) - 15 (hard) while following your exercise prescription.  We will make changes to your prescription with you as you progress through the program.   Duration: Be able to do 30 to 45 minutes of continuous aerobic exercise in addition to a 5 minute warm-up and a 5 minute cool-down routine.   Nutrition Goals: Your personal nutrition goals will  be established when you do your nutrition analysis with the dietician.  The following are general nutrition guidelines to follow: Cholesterol < 200mg /day Sodium < 1500mg /day Fiber: Women over 50 yrs - 21 grams per day  Personal Goals:  Personal Goals and Risk Factors at Admission - 06/14/21 1221       Core Components/Risk Factors/Patient Goals on Admission    Weight Management Yes;Weight Maintenance    Intervention Weight Management: Develop a combined nutrition and exercise program designed to reach desired caloric intake, while maintaining appropriate intake of nutrient and fiber, sodium and fats, and appropriate energy expenditure required for the weight goal.;Weight Management: Provide education and appropriate resources to help participant work on and attain dietary goals.;Weight Management/Obesity: Establish reasonable short term and long term weight goals.    Admit Weight 123 lb (55.8 kg)    Goal Weight: Short Term 123 lb (55.8 kg)    Goal Weight: Long Term 123 lb (55.8 kg)    Expected Outcomes Short Term: Continue to assess and modify interventions until short term weight is achieved;Long Term: Adherence to nutrition and physical activity/exercise program aimed toward attainment of established weight goal;Weight  Maintenance: Understanding of the daily nutrition guidelines, which includes 25-35% calories from fat, 7% or less cal from saturated fats, less than 200mg  cholesterol, less than 1.5gm of sodium, & 5 or more servings of fruits and vegetables daily;Understanding recommendations for meals to include 15-35% energy as protein, 25-35% energy from fat, 35-60% energy from carbohydrates, less than 200mg  of dietary cholesterol, 20-35 gm of total fiber daily;Understanding of distribution of calorie intake throughout the day with the consumption of 4-5 meals/snacks    Hypertension Yes    Intervention Provide education on lifestyle modifcations including regular physical activity/exercise,  weight management, moderate sodium restriction and increased consumption of fresh fruit, vegetables, and low fat dairy, alcohol moderation, and smoking cessation.;Monitor prescription use compliance.    Expected Outcomes Short Term: Continued assessment and intervention until BP is < 140/52mm HG in hypertensive participants. < 130/88mm HG in hypertensive participants with diabetes, heart failure or chronic kidney disease.;Long Term: Maintenance of blood pressure at goal levels.    Lipids Yes    Intervention Provide education and support for participant on nutrition & aerobic/resistive exercise along with prescribed medications to achieve LDL 70mg , HDL >40mg .    Expected Outcomes Short Term: Participant states understanding of desired cholesterol values and is compliant with medications prescribed. Participant is following exercise prescription and nutrition guidelines.;Long Term: Cholesterol controlled with medications as prescribed, with individualized exercise RX and with personalized nutrition plan. Value goals: LDL < 70mg , HDL > 40 mg.             Tobacco Use Initial Evaluation: Social History   Tobacco Use  Smoking Status Never  Smokeless Tobacco Never    Exercise Goals and Review:  Exercise Goals     Row Name 06/14/21 1221             Exercise Goals   Increase Physical Activity Yes       Intervention Provide advice, education, support and counseling about physical activity/exercise needs.;Develop an individualized exercise prescription for aerobic and resistive training based on initial evaluation findings, risk stratification, comorbidities and participant's personal goals.       Expected Outcomes Short Term: Attend rehab on a regular basis to increase amount of physical activity.;Long Term: Add in home exercise to make exercise part of routine and to increase amount of physical activity.;Long Term: Exercising regularly at least 3-5 days a week.       Increase Strength and  Stamina Yes       Intervention Provide advice, education, support and counseling about physical activity/exercise needs.;Develop an individualized exercise prescription for aerobic and resistive training based on initial evaluation findings, risk stratification, comorbidities and participant's personal goals.       Expected Outcomes Short Term: Increase workloads from initial exercise prescription for resistance, speed, and METs.;Short Term: Perform resistance training exercises routinely during rehab and add in resistance training at home;Long Term: Improve cardiorespiratory fitness, muscular endurance and strength as measured by increased METs and functional capacity (6MWT)       Able to understand and use rate of perceived exertion (RPE) scale Yes       Intervention Provide education and explanation on how to use RPE scale       Expected Outcomes Short Term: Able to use RPE daily in rehab to express subjective intensity level;Long Term:  Able to use RPE to guide intensity level when exercising independently       Able to understand and use Dyspnea scale Yes       Intervention Provide  education and explanation on how to use Dyspnea scale       Expected Outcomes Short Term: Able to use Dyspnea scale daily in rehab to express subjective sense of shortness of breath during exertion;Long Term: Able to use Dyspnea scale to guide intensity level when exercising independently       Knowledge and understanding of Target Heart Rate Range (THRR) Yes       Intervention Provide education and explanation of THRR including how the numbers were predicted and where they are located for reference       Expected Outcomes Short Term: Able to state/look up THRR;Long Term: Able to use THRR to govern intensity when exercising independently;Short Term: Able to use daily as guideline for intensity in rehab       Able to check pulse independently Yes       Intervention Provide education and demonstration on how to check pulse  in carotid and radial arteries.;Review the importance of being able to check your own pulse for safety during independent exercise       Expected Outcomes Short Term: Able to explain why pulse checking is important during independent exercise;Long Term: Able to check pulse independently and accurately       Understanding of Exercise Prescription Yes       Intervention Provide education, explanation, and written materials on patient's individual exercise prescription       Expected Outcomes Short Term: Able to explain program exercise prescription;Long Term: Able to explain home exercise prescription to exercise independently                Copy of goals given to participant.

## 2021-06-19 ENCOUNTER — Other Ambulatory Visit: Payer: Self-pay

## 2021-06-19 ENCOUNTER — Encounter: Payer: Medicare HMO | Admitting: *Deleted

## 2021-06-19 DIAGNOSIS — Z955 Presence of coronary angioplasty implant and graft: Secondary | ICD-10-CM

## 2021-06-19 DIAGNOSIS — I214 Non-ST elevation (NSTEMI) myocardial infarction: Secondary | ICD-10-CM | POA: Diagnosis not present

## 2021-06-19 NOTE — Progress Notes (Signed)
Daily Session Note ° °Patient Details  °Name: Kaitlyn Roman °MRN: [id number] °Date of Birth: 06/17/1945 °Referring Provider:   °Flowsheet Row Cardiac Rehab from 06/14/2021 in [facility]  °Referring Provider Kalra, Girish MD  ° °  ° ° °Encounter Date: 06/19/2021 ° °Check In: ° Session Check In - 06/19/21 1035   ° °  ° Check-In  ° Supervising physician immediately available to respond to emergencies See telemetry face sheet for immediately available ER MD   ° Location [facility]   ° Staff Present  , RN BSN;Joseph Hood, RCP,RRT,BSRT;Kelly Hayes, BS, ACSM CEP, Exercise Physiologist   ° Virtual Visit No   ° Medication changes reported     No   ° Fall or balance concerns reported    No   ° Warm-up and Cool-down Performed on first and last piece of equipment   ° Resistance Training Performed Yes   ° VAD Patient? No   ° PAD/SET Patient? No   °  ° Pain Assessment  ° Currently in Pain? No/denies   ° °  °  ° °  ° ° ° ° ° °Social History  ° °Tobacco Use  °Smoking Status Never  °Smokeless Tobacco Never  ° ° °Goals Met:  °Independence with exercise equipment °Exercise tolerated well °No report of concerns or symptoms today °Strength training completed today ° °Goals Unmet:  °Not Applicable ° °Comments: First full day of exercise!  Patient was oriented to gym and equipment including functions, settings, policies, and procedures.  Patient's individual exercise prescription and treatment plan were reviewed.  All starting workloads were established based on the results of the 6 minute walk test done at initial orientation visit.  The plan for exercise progression was also introduced and progression will be customized based on patient's performance and goals. ° ° ° °Dr. Mark Miller is Medical Director for [facility].  °Dr. Fuad Aleskerov is Medical Director for [facility]. °

## 2021-06-21 ENCOUNTER — Encounter: Payer: Self-pay | Admitting: *Deleted

## 2021-06-21 DIAGNOSIS — I214 Non-ST elevation (NSTEMI) myocardial infarction: Secondary | ICD-10-CM

## 2021-06-21 DIAGNOSIS — Z955 Presence of coronary angioplasty implant and graft: Secondary | ICD-10-CM

## 2021-06-21 NOTE — Progress Notes (Signed)
Cardiac Individual Treatment Plan  Patient Details  Name: Kaitlyn Roman MRN: [id number] Date of Birth: Feb 23, 1946 Referring Provider:   Flowsheet Row Cardiac Rehab from 06/14/2021 in [facility] Cardiac and Pulmonary Rehab  Referring Provider Estell Harpin MD       Initial Encounter Date:  Flowsheet Row Cardiac Rehab [facility]14/[facility]  Date 06/14/21       Visit Diagnosis: NSTEMI (non-ST elevated myocardial infarction) [facility])  Status post coronary artery stent placement  Patient's Home Medications on Admission:  Current Outpatient Medications:    acetaminophen (TYLENOL) 650 MG CR tablet, Take by mouth., Disp: , Rfl:    aspirin 81 MG tablet, Take 81 mg by mouth daily., Disp: , Rfl:    atorvastatin (LIPITOR) 40 MG tablet, take 1 tablet by mouth once daily (Patient not taking: Reported on 06/05/2021), Disp: 90 tablet, Rfl: 1   Cholecalciferol (VITAMIN D-3) 1000 UNITS CAPS, Take 1 capsule by mouth daily., Disp: , Rfl:    clopidogrel (PLAVIX) 75 MG tablet, Take 75 mg by mouth daily., Disp: , Rfl:    Coenzyme Q10 (CO Q 10) 100 MG CAPS, Take 1 capsule by mouth daily. (Patient not taking: Reported on 06/05/2021), Disp: , Rfl:    diphenhydrAMINE (BENADRYL) 25 mg capsule, Take by mouth., Disp: , Rfl:    doxylamine, Sleep, (UNISOM) 25 MG tablet, Take 12.5 mg by mouth at bedtime as needed for sleep.  (Patient not taking: Reported on 06/05/2021), Disp: , Rfl:    fluticasone (FLONASE) 50 MCG/ACT nasal spray, Place 2 sprays into both nostrils daily. (Patient not taking: Reported on 06/05/2021), Disp: 16 g, Rfl: 6   glucosamine-chondroitin 500-400 MG tablet, Take 2 tablets by mouth daily. (Patient not taking: Reported on 06/05/2021), Disp: , Rfl:    losartan (COZAAR) 25 MG tablet, Take 25 mg by mouth daily., Disp: , Rfl:    losartan-hydrochlorothiazide (HYZAAR) 100-12.5 MG tablet, Take 1 tablet by mouth daily. (Patient not taking: Reported on 06/05/2021), Disp: 90 tablet, Rfl: 1    meloxicam (MOBIC) 15 MG tablet, Take 1 tablet (15 mg total) by mouth daily. (Patient not taking: Reported on 06/05/2021), Disp: 90 tablet, Rfl: 1   metoprolol succinate (TOPROL-XL) 25 MG 24 hr tablet, Take 25 mg by mouth daily., Disp: , Rfl:    Multiple Vitamins-Minerals (HAIR SKIN AND NAILS FORMULA PO), Take 1 tablet by mouth daily. (Patient not taking: Reported on 06/05/2021), Disp: , Rfl:    nitroGLYCERIN (NITROSTAT) 0.4 MG SL tablet, Place under the tongue., Disp: , Rfl:    Potassium 99 MG TABS, Take 1 tablet by mouth daily., Disp: , Rfl:    Probiotic Product (PROBIOTIC PO), Take 1 capsule by mouth daily. (Patient not taking: Reported on 06/05/2021), Disp: , Rfl:    rosuvastatin (CRESTOR) 20 MG tablet, Take 20 mg by mouth at bedtime., Disp: , Rfl:   Past Medical History: Past Medical History:  Diagnosis Date   Arthritis    Brain tumor ([place])    Chronic kidney disease    renal cysts   Colitis, infectious April 2013    C dificile colitis, hosp [facility] April 2013 x 2 weeks   Hyperlipidemia    Hypertension    Polymyalgia rheumatica syndrome ([place])    rheumatoloigst Dr [facility] at [place] Use: Social History   Tobacco Use  Smoking Status Never  Smokeless Tobacco Never    Labs: Recent Review Flowsheet Data     Labs for ITP Cardiac  and Pulmonary Rehab Latest Ref Rng & Units 09/22/2014 12/20/2015 06/12/2016 08/17/2016 02/22/2017   Cholestrol 0 - 200 mg/dL 190 - - 250(H) 224(H)   LDLCALC 0 - 99 mg/dL 97 - - 146(H) 124(H)   LDLDIRECT mg/dL - 104.0 125.0 - -   HDL >39.00 mg/dL 72.40 - - 82.90 81.90   Trlycerides 0.0 - 149.0 mg/dL 103.0 - - 106.0 92.0        Exercise Target Goals: Exercise Program Goal: Individual exercise prescription set using results from initial 6 min walk test and THRR while considering  patients activity barriers and safety.   Exercise Prescription Goal: Initial exercise prescription builds to 30-45 minutes a day of aerobic activity, 2-3 days per  week.  Home exercise guidelines will be given to patient during program as part of exercise prescription that the participant will acknowledge.   Education: Aerobic Exercise: - Group verbal and visual presentation on the components of exercise prescription. Introduces F.I.T.T principle from ACSM for exercise prescriptions.  Reviews F.I.T.T. principles of aerobic exercise including progression. Written material given at graduation.   Education: Resistance Exercise: - Group verbal and visual presentation on the components of exercise prescription. Introduces F.I.T.T principle from ACSM for exercise prescriptions  Reviews F.I.T.T. principles of resistance exercise including progression. Written material given at graduation.    Education: Exercise & Equipment Safety: - Individual verbal instruction and demonstration of equipment use and safety with use of the equipment. Flowsheet Row Cardiac Rehab from 06/14/2021 in [facility] Cardiac and Pulmonary Rehab  Education need identified 06/14/21  Date 06/14/21  Educator [place]  Instruction Review Code 1- Verbalizes Understanding       Education: Exercise Physiology & General Exercise Guidelines: - Group verbal and written instruction with models to review the exercise physiology of the cardiovascular system and associated critical values. Provides general exercise guidelines with specific guidelines to those with heart or lung disease.  Flowsheet Row Cardiac Rehab from 06/14/2021 in [facility] Cardiac and Pulmonary Rehab  Education need identified 06/14/21       Education: Flexibility, Balance, Mind/Body Relaxation: - Group verbal and visual presentation with interactive activity on the components of exercise prescription. Introduces F.I.T.T principle from ACSM for exercise prescriptions. Reviews F.I.T.T. principles of flexibility and balance exercise training including progression. Also discusses the mind body connection.  Reviews various relaxation techniques  to help reduce and manage stress (i.e. Deep breathing, progressive muscle relaxation, and visualization). Balance handout provided to take home. Written material given at graduation.   Activity Barriers & Risk Stratification:  Activity Barriers & Cardiac Risk Stratification - 06/14/21 1156       Activity Barriers & Cardiac Risk Stratification   Activity Barriers Arthritis;Balance Concerns    Cardiac Risk Stratification Moderate             6 Minute Walk:  6 Minute Walk     Row Name 06/14/21 1157         6 Minute Walk   Phase Initial     Distance 1180 feet     Walk Time 6 minutes     # of Rest Breaks 0     MPH 2.23     METS 2.12     RPE 7     Perceived Dyspnea  0     VO2 Peak 7.43     Symptoms No     Resting HR 58 bpm     Resting BP 122/70     Resting Oxygen Saturation  96 %  Exercise Oxygen Saturation  during 6 min walk 99 %     Max Ex. HR 71 bpm     Max Ex. BP 132/72     2 Minute Post BP 112/68              Oxygen Initial Assessment:   Oxygen Re-Evaluation:   Oxygen Discharge (Final Oxygen Re-Evaluation):   Initial Exercise Prescription:  Initial Exercise Prescription - 06/14/21 1200       Date of Initial Exercise RX and Referring Provider   Date 06/14/21    Referring Provider Estell Harpin MD      Oxygen   Maintain Oxygen Saturation 88% or higher      Treadmill   MPH 2.3    Grade 1    Minutes 15    METs 3.08      NuStep   Level 3    SPM 80    Minutes 15    METs 2.12      Elliptical   Level 1    Speed 2.5    Minutes 15    METs 2.1      Prescription Details   Frequency (times per week) 2    Duration Progress to 30 minutes of continuous aerobic without signs/symptoms of physical distress      Intensity   THRR 40-80% of Max Heartrate 92-126    Ratings of Perceived Exertion 11-13    Perceived Dyspnea 0-4      Progression   Progression Continue to progress workloads to maintain intensity without signs/symptoms of physical  distress.      Resistance Training   Training Prescription Yes    Weight 3 lb    Reps 10-15             Perform Capillary Blood Glucose checks as needed.  Exercise Prescription Changes:   Exercise Prescription Changes     Row Name 06/14/21 1200 06/19/21 1200           Response to Exercise   Blood Pressure (Admit) 122/70 122/62      Blood Pressure (Exercise) 132/72 128/64      Blood Pressure (Exit) 112/68 120/64      Heart Rate (Admit) 58 bpm 58 bpm      Heart Rate (Exercise) 71 bpm 85 bpm      Heart Rate (Exit) 56 bpm 60 bpm      Oxygen Saturation (Admit) 96 % --      Oxygen Saturation (Exercise) 99 % --      Rating of Perceived Exertion (Exercise) 7 12      Perceived Dyspnea (Exercise) 0 --      Symptoms none none      Comments walk test results first full day of exercise      Duration -- Progress to 30 minutes of  aerobic without signs/symptoms of physical distress      Intensity -- THRR unchanged        Progression   Progression -- Continue to progress workloads to maintain intensity without signs/symptoms of physical distress.      Average METs -- 3.73        Resistance Training   Training Prescription -- Yes      Weight -- 3 lb      Reps -- 10-15        Interval Training   Interval Training -- No        Treadmill   MPH -- 2.5      Grade --  1      Minutes -- 15      METs -- 3.26        REL-XR   Level -- 3      Minutes -- 15      METs -- 4.2        Oxygen   Maintain Oxygen Saturation -- 88% or higher               Exercise Comments:   Exercise Goals and Review:   Exercise Goals     Row Name 06/14/21 1221             Exercise Goals   Increase Physical Activity Yes       Intervention Provide advice, education, support and counseling about physical activity/exercise needs.;Develop an individualized exercise prescription for aerobic and resistive training based on initial evaluation findings, risk stratification, comorbidities and  participant's personal goals.       Expected Outcomes Short Term: Attend rehab on a regular basis to increase amount of physical activity.;Long Term: Add in home exercise to make exercise part of routine and to increase amount of physical activity.;Long Term: Exercising regularly at least 3-5 days a week.       Increase Strength and Stamina Yes       Intervention Provide advice, education, support and counseling about physical activity/exercise needs.;Develop an individualized exercise prescription for aerobic and resistive training based on initial evaluation findings, risk stratification, comorbidities and participant's personal goals.       Expected Outcomes Short Term: Increase workloads from initial exercise prescription for resistance, speed, and METs.;Short Term: Perform resistance training exercises routinely during rehab and add in resistance training at home;Long Term: Improve cardiorespiratory fitness, muscular endurance and strength as measured by increased METs and functional capacity (6MWT)       Able to understand and use rate of perceived exertion (RPE) scale Yes       Intervention Provide education and explanation on how to use RPE scale       Expected Outcomes Short Term: Able to use RPE daily in rehab to express subjective intensity level;Long Term:  Able to use RPE to guide intensity level when exercising independently       Able to understand and use Dyspnea scale Yes       Intervention Provide education and explanation on how to use Dyspnea scale       Expected Outcomes Short Term: Able to use Dyspnea scale daily in rehab to express subjective sense of shortness of breath during exertion;Long Term: Able to use Dyspnea scale to guide intensity level when exercising independently       Knowledge and understanding of Target Heart Rate Range (THRR) Yes       Intervention Provide education and explanation of THRR including how the numbers were predicted and where they are located for  reference       Expected Outcomes Short Term: Able to state/look up THRR;Long Term: Able to use THRR to govern intensity when exercising independently;Short Term: Able to use daily as guideline for intensity in rehab       Able to check pulse independently Yes       Intervention Provide education and demonstration on how to check pulse in carotid and radial arteries.;Review the importance of being able to check your own pulse for safety during independent exercise       Expected Outcomes Short Term: Able to explain why pulse checking is important during independent exercise;Long Term: Able  to check pulse independently and accurately       Understanding of Exercise Prescription Yes       Intervention Provide education, explanation, and written materials on patient's individual exercise prescription       Expected Outcomes Short Term: Able to explain program exercise prescription;Long Term: Able to explain home exercise prescription to exercise independently                Exercise Goals Re-Evaluation :  Exercise Goals Re-Evaluation     Row Name 06/19/21 1036             Exercise Goal Re-Evaluation   Exercise Goals Review Increase Physical Activity;Able to understand and use rate of perceived exertion (RPE) scale;Knowledge and understanding of Target Heart Rate Range (THRR);Understanding of Exercise Prescription;Increase Strength and Stamina;Able to check pulse independently       Comments Reviewed RPE and dyspnea scales, THR and program prescription with pt today.  Pt voiced understanding and was given a copy of goals to take home.       Expected Outcomes Short: Use RPE daily to regulate intensity. Long: Follow program prescription in THR.                Discharge Exercise Prescription (Final Exercise Prescription Changes):  Exercise Prescription Changes - 06/19/21 1200       Response to Exercise   Blood Pressure (Admit) 122/62    Blood Pressure (Exercise) 128/64    Blood  Pressure (Exit) 120/64    Heart Rate (Admit) 58 bpm    Heart Rate (Exercise) 85 bpm    Heart Rate (Exit) 60 bpm    Rating of Perceived Exertion (Exercise) 12    Symptoms none    Comments first full day of exercise    Duration Progress to 30 minutes of  aerobic without signs/symptoms of physical distress    Intensity THRR unchanged      Progression   Progression Continue to progress workloads to maintain intensity without signs/symptoms of physical distress.    Average METs 3.73      Resistance Training   Training Prescription Yes    Weight 3 lb    Reps 10-15      Interval Training   Interval Training No      Treadmill   MPH 2.5    Grade 1    Minutes 15    METs 3.26      REL-XR   Level 3    Minutes 15    METs 4.2      Oxygen   Maintain Oxygen Saturation 88% or higher             Nutrition:  Target Goals: Understanding of nutrition guidelines, daily intake of sodium 1500mg , cholesterol 200mg , calories 30% from fat and 7% or less from saturated fats, daily to have 5 or more servings of fruits and vegetables.  Education: All About Nutrition: -Group instruction provided by verbal, written material, interactive activities, discussions, models, and posters to present general guidelines for heart healthy nutrition including fat, fiber, MyPlate, the role of sodium in heart healthy nutrition, utilization of the nutrition label, and utilization of this knowledge for meal planning. Follow up email sent as well. Written material given at graduation. Flowsheet Row Cardiac Rehab from 06/14/2021 in [facility] Cardiac and Pulmonary Rehab  Education need identified 06/14/21       Biometrics:  Pre Biometrics - 06/14/21 1156       Pre Biometrics   Height 5' 1.2" (  1.554 m)    Weight 123 lb 9.6 oz (56.1 kg)    BMI (Calculated) 23.22    Single Leg Stand 7.19 seconds              Nutrition Therapy Plan and Nutrition Goals:  Nutrition Therapy & Goals - 06/14/21 1153        Intervention Plan   Intervention Prescribe, educate and counsel regarding individualized specific dietary modifications aiming towards targeted core components such as weight, hypertension, lipid management, diabetes, heart failure and other comorbidities.    Expected Outcomes Short Term Goal: Understand basic principles of dietary content, such as calories, fat, sodium, cholesterol and nutrients.;Short Term Goal: A plan has been developed with personal nutrition goals set during dietitian appointment.;Long Term Goal: Adherence to prescribed nutrition plan.             Nutrition Assessments:  MEDIFICTS Score Key: ?70 Need to make dietary changes  40-70 Heart Healthy Diet ? 40 Therapeutic Level Cholesterol Diet  Flowsheet Row Cardiac Rehab from 06/14/2021 in [facility] Cardiac and Pulmonary Rehab  Picture Your Plate Total Score on Admission 61      Picture Your Plate Scores: <38 Unhealthy dietary pattern with much room for improvement. 41-50 Dietary pattern unlikely to meet recommendations for good health and room for improvement. 51-60 More healthful dietary pattern, with some room for improvement.  >60 Healthy dietary pattern, although there may be some specific behaviors that could be improved.    Nutrition Goals Re-Evaluation:  Nutrition Goals Re-Evaluation     [place] Name 06/19/21 1457             Goals   Comment Would like to think about making appointment with RD, will check back in Wednesday 06/21/21.                Nutrition Goals Discharge (Final Nutrition Goals Re-Evaluation):  Nutrition Goals Re-Evaluation - 06/19/21 1457       Goals   Comment Would like to think about making appointment with RD, will check back in Wednesday 06/21/21.             Psychosocial: Target Goals: Acknowledge presence or absence of significant depression and/or stress, maximize coping skills, provide positive support system. Participant is able to verbalize types and ability to  use techniques and skills needed for reducing stress and depression.   Education: Stress, Anxiety, and Depression - Group verbal and visual presentation to define topics covered.  Reviews how body is impacted by stress, anxiety, and depression.  Also discusses healthy ways to reduce stress and to treat/manage anxiety and depression.  Written material given at graduation.   Education: Sleep Hygiene -Provides group verbal and written instruction about how sleep can affect your health.  Define sleep hygiene, discuss sleep cycles and impact of sleep habits. Review good sleep hygiene tips.    Initial Review & Psychosocial Screening:  Initial Psych Review & Screening - 06/05/21 1419       Initial Review   Current issues with None Identified      Family Dynamics   Good Support System? Yes   husband     Barriers   Psychosocial barriers to participate in program There are no identifiable barriers or psychosocial needs.;The patient should benefit from training in stress management and relaxation.      Screening Interventions   Interventions Provide feedback about the scores to participant;Encouraged to exercise;To provide support and resources with identified psychosocial needs    Expected Outcomes Short Term  goal: Utilizing psychosocial counselor, staff and physician to assist with identification of specific Stressors or current issues interfering with healing process. Setting desired goal for each stressor or current issue identified.;Long Term Goal: Stressors or current issues are controlled or eliminated.;Short Term goal: Identification and review with participant of any Quality of Life or Depression concerns found by scoring the questionnaire.;Long Term goal: The participant improves quality of Life and PHQ9 Scores as seen by post scores and/or verbalization of changes             Quality of Life Scores:   Quality of Life - 06/14/21 1153       Quality of Life   Select Quality of Life       Quality of Life Scores   Health/Function Pre 28.71 %    Socioeconomic Pre 28.75 %    Psych/Spiritual Pre 30 %    Family Pre 30 %    GLOBAL Pre 29.2 %            Scores of 19 and below usually indicate a poorer quality of life in these areas.  A difference of  2-3 points is a clinically meaningful difference.  A difference of 2-3 points in the total score of the Quality of Life Index has been associated with significant improvement in overall quality of life, self-image, physical symptoms, and general health in studies assessing change in quality of life.  PHQ-9: Recent Review Flowsheet Data     Depression screen [facility] 2/9 06/14/2021 06/06/2016 09/22/2014 02/24/2013   Decreased Interest 0 0 0 0   Down, Depressed, Hopeless 0 0 0 0   PHQ - 2 Score 0 0 0 0   Altered sleeping 0 - - -   Tired, decreased energy 1 - - -   Change in appetite 0 - - -   Feeling bad or failure about yourself  0 - - -   Trouble concentrating 0 - - -   Moving slowly or fidgety/restless 0 - - -   Suicidal thoughts 0 - - -   PHQ-9 Score 1 - - -   Difficult doing work/chores Not difficult at all - - -      Interpretation of Total Score  Total Score Depression Severity:  1-4 = Minimal depression, 5-9 = Mild depression, 10-14 = Moderate depression, 15-19 = Moderately severe depression, 20-27 = Severe depression   Psychosocial Evaluation and Intervention:  Psychosocial Evaluation - 06/05/21 1423       Psychosocial Evaluation & Interventions   Interventions Encouraged to exercise with the program and follow exercise prescription    Comments Kaitlyn Roman reports doing well post MI and stent. She does state her stamina is still sluggish. Her husband helps manage her health care and plans to be involved. She does not report any current stressors. 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 She  does have a $45 copay so she is planning on doing the program short term. She is already exercising on her own and is excited to learn more about heart healthy living.    Expected Outcomes Short: attend cardiac rehab for education and exercise. Long: develop and maintain positive self care habits.    Continue Psychosocial Services  Follow up required by staff             Psychosocial Re-Evaluation:  Psychosocial Discharge (Final Psychosocial Re-Evaluation):   Vocational Rehabilitation: Provide vocational rehab assistance to qualifying candidates.   Vocational Rehab Evaluation & Intervention:  Vocational Rehab - 06/05/21 1419       Initial Vocational Rehab Evaluation & Intervention   Assessment shows need for Vocational Rehabilitation No             Education: Education Goals: Education classes will be provided on a variety of topics geared toward better understanding of heart health and risk factor modification. Participant will state understanding/return demonstration of topics presented as noted by education test scores.  Learning Barriers/Preferences:  Learning Barriers/Preferences - 06/05/21 1409       Learning Barriers/Preferences   Learning Barriers Hearing   deaf in left ear   Learning Preferences None             General Cardiac Education Topics:  AED/CPR: - Group verbal and written instruction with the use of models to demonstrate the basic use of the AED with the basic ABC's of resuscitation.   Anatomy and Cardiac Procedures: - Group verbal and visual presentation and models provide information about basic cardiac anatomy and function. Reviews the testing methods done to diagnose heart disease and the outcomes of the test results. Describes the treatment choices: Medical Management, Angioplasty, or Coronary Bypass Surgery for treating various heart conditions including Myocardial Infarction, Angina, Valve Disease, and Cardiac Arrhythmias.  Written  material given at graduation.   Medication Safety: - Group verbal and visual instruction to review commonly prescribed medications for heart and lung disease. Reviews the medication, class of the drug, and side effects. Includes the steps to properly store meds and maintain the prescription regimen.  Written material given at graduation.   Intimacy: - Group verbal instruction through game format to discuss how heart and lung disease can affect sexual intimacy. Written material given at graduation..   Know Your Numbers and Heart Failure: - Group verbal and visual instruction to discuss disease risk factors for cardiac and pulmonary disease and treatment options.  Reviews associated critical values for Overweight/Obesity, Hypertension, Cholesterol, and Diabetes.  Discusses basics of heart failure: signs/symptoms and treatments.  Introduces Heart Failure Zone chart for action plan for heart failure.  Written material given at graduation.   Infection Prevention: - Provides verbal and written material to individual with discussion of infection control including proper hand washing and proper equipment cleaning during exercise session. Flowsheet Row Cardiac Rehab from 06/14/2021 in [facility] Cardiac and Pulmonary Rehab  Education need identified 06/14/21  Date 06/14/21  Educator [place]  Instruction Review Code 1- Verbalizes Understanding       Falls Prevention: - Provides verbal and written material to individual with discussion of falls prevention and safety. Flowsheet Row Cardiac Rehab from 06/14/2021 in [facility] Cardiac and Pulmonary Rehab  Education need identified 06/14/21  Date 06/14/21  Educator [place]  Instruction Review Code 1- Verbalizes Understanding       Other: -Provides group and verbal instruction on various topics (see comments)   Knowledge Questionnaire Score:  Knowledge Questionnaire Score - 06/14/21 1151       Knowledge Questionnaire Score   Pre Score 24/26: Nutrition,  Exercise             Core Components/Risk Factors/Patient Goals at Admission:  Personal Goals and Risk Factors at Admission - 06/14/21 1221       Core Components/Risk Factors/Patient Goals on Admission    Weight Management Yes;Weight Maintenance    Intervention Weight Management: Develop a combined nutrition  and exercise program designed to reach desired caloric intake, while maintaining appropriate intake of nutrient and fiber, sodium and fats, and appropriate energy expenditure required for the weight goal.;Weight Management: Provide education and appropriate resources to help participant work on and attain dietary goals.;Weight Management/Obesity: Establish reasonable short term and long term weight goals.    Admit Weight 123 lb (55.8 kg)    Goal Weight: Short Term 123 lb (55.8 kg)    Goal Weight: Long Term 123 lb (55.8 kg)    Expected Outcomes Short Term: Continue to assess and modify interventions until short term weight is achieved;Long Term: Adherence to nutrition and physical activity/exercise program aimed toward attainment of established weight goal;Weight Maintenance: Understanding of the daily nutrition guidelines, which includes 25-35% calories from fat, 7% or less cal from saturated fats, less than 200mg  cholesterol, less than 1.5gm of sodium, & 5 or more servings of fruits and vegetables daily;Understanding recommendations for meals to include 15-35% energy as protein, 25-35% energy from fat, 35-60% energy from carbohydrates, less than 200mg  of dietary cholesterol, 20-35 gm of total fiber daily;Understanding of distribution of calorie intake throughout the day with the consumption of 4-5 meals/snacks    Hypertension Yes    Intervention Provide education on lifestyle modifcations including regular physical activity/exercise, weight management, moderate sodium restriction and increased consumption of fresh fruit, vegetables, and low fat dairy, alcohol moderation, and smoking  cessation.;Monitor prescription use compliance.    Expected Outcomes Short Term: Continued assessment and intervention until BP is < 140/71mm HG in hypertensive participants. < 130/47mm HG in hypertensive participants with diabetes, heart failure or chronic kidney disease.;Long Term: Maintenance of blood pressure at goal levels.    Lipids Yes    Intervention Provide education and support for participant on nutrition & aerobic/resistive exercise along with prescribed medications to achieve LDL 70mg , HDL >40mg .    Expected Outcomes Short Term: Participant states understanding of desired cholesterol values and is compliant with medications prescribed. Participant is following exercise prescription and nutrition guidelines.;Long Term: Cholesterol controlled with medications as prescribed, with individualized exercise RX and with personalized nutrition plan. Value goals: LDL < 70mg , HDL > 40 mg.             Education:Diabetes - Individual verbal and written instruction to review signs/symptoms of diabetes, desired ranges of glucose level fasting, after meals and with exercise. Acknowledge that pre and post exercise glucose checks will be done for 3 sessions at entry of program.   Core Components/Risk Factors/Patient Goals Review:    Core Components/Risk Factors/Patient Goals at Discharge (Final Review):    ITP Comments:  ITP Comments     Row Name 06/05/21 1433 06/14/21 1150 06/19/21 1035 06/21/21 0910     ITP Comments Initial telephone orientation completed. Diagnosis can be found in [facility] 11/30. EP orientation scheduled for Wednesday 1/18 at 10:30. Completed 6MWT and gym orientation. Initial ITP created and sent for review to Dr. Emily Filbert,  Medical Director. First full day of exercise!  Patient was oriented to gym and equipment including functions, settings, policies, and procedures.  Patient's individual exercise prescription and treatment plan were reviewed.  All starting workloads were  established based on the results of the 6 minute walk test done at initial orientation visit.  The plan for exercise progression was also introduced and progression will be customized based on patient's performance and goals. 30 Day review completed. Medical Director ITP review done, changes made as directed, and signed approval by Medical Director.   new to  program             Comments:

## 2021-06-23 ENCOUNTER — Encounter: Payer: Medicare HMO | Admitting: *Deleted

## 2021-06-23 ENCOUNTER — Other Ambulatory Visit: Payer: Self-pay

## 2021-06-23 DIAGNOSIS — I214 Non-ST elevation (NSTEMI) myocardial infarction: Secondary | ICD-10-CM

## 2021-06-23 DIAGNOSIS — Z955 Presence of coronary angioplasty implant and graft: Secondary | ICD-10-CM

## 2021-06-23 NOTE — Progress Notes (Signed)
Daily Session Note  Patient Details  Name: Kaitlyn Roman MRN: [id number] Date of Birth: Dec 15, 1945 Referring Provider:   Flowsheet Row Cardiac Rehab from 06/14/2021 in [facility] Cardiac and Pulmonary Rehab  Referring Provider Estell Harpin MD       Encounter Date: 06/23/2021  Check In:  Session Check In - 06/23/21 0958       Check-In   Supervising physician immediately available to respond to emergencies See telemetry face sheet for immediately available ER MD    Location [facility]-Cardiac & Pulmonary Rehab    Staff Present Renita Papa, RN BSN;Joseph Tessie Fass, RCP,RRT,BSRT;Melissa [place], [place], LDN    Virtual Visit No    Medication changes reported     No    Fall or balance concerns reported    No    Warm-up and Cool-down Performed on first and last piece of equipment    Resistance Training Performed Yes    VAD Patient? No    PAD/SET Patient? No      Pain Assessment   Currently in Pain? No/denies                Social History   Tobacco Use  Smoking Status Never  Smokeless Tobacco Never    Goals Met:  Independence with exercise equipment Exercise tolerated well No report of concerns or symptoms today Strength training completed today  Goals Unmet:  Not Applicable  Comments: Pt able to follow exercise prescription today without complaint.  Will continue to monitor for progression.    Dr. Emily Filbert is Medical Director for [place].  Dr. Ottie Glazier is Medical Director for [facility] Pulmonary Rehabilitation.

## 2021-06-26 ENCOUNTER — Encounter: Payer: Medicare HMO | Admitting: *Deleted

## 2021-06-26 ENCOUNTER — Other Ambulatory Visit: Payer: Self-pay

## 2021-06-26 DIAGNOSIS — I214 Non-ST elevation (NSTEMI) myocardial infarction: Secondary | ICD-10-CM

## 2021-06-26 DIAGNOSIS — Z955 Presence of coronary angioplasty implant and graft: Secondary | ICD-10-CM

## 2021-06-26 NOTE — Progress Notes (Signed)
Daily Session Note  Patient Details  Name: Kaitlyn Roman MRN: [id number] Date of Birth: June 17, 1945 Referring Provider:   Flowsheet Row Cardiac Rehab from 06/14/2021 in [facility] Cardiac and Pulmonary Rehab  Referring Provider Estell Harpin MD       Encounter Date: 06/26/2021  Check In:  Session Check In - 06/26/21 1004       Check-In   Supervising physician immediately available to respond to emergencies See telemetry face sheet for immediately available ER MD    Location [facility]-Cardiac & Pulmonary Rehab    Staff Present Renita Papa, RN BSN;Joseph [place], RCP,RRT,BSRT;Laureen [place], [place], RRT, CPFT    Virtual Visit No    Medication changes reported     No    Fall or balance concerns reported    No    Warm-up and Cool-down Performed on first and last piece of equipment    Resistance Training Performed Yes    VAD Patient? No    PAD/SET Patient? No      Pain Assessment   Currently in Pain? No/denies                Social History   Tobacco Use  Smoking Status Never  Smokeless Tobacco Never    Goals Met:  Independence with exercise equipment Exercise tolerated well No report of concerns or symptoms today Strength training completed today  Goals Unmet:  Not Applicable  Comments: Pt able to follow exercise prescription today without complaint.  Will continue to monitor for progression.    Dr. Emily Filbert is Medical Director for [place].  Dr. Ottie Glazier is Medical Director for [facility] Pulmonary Rehabilitation.

## 2021-06-28 ENCOUNTER — Encounter: Payer: Medicare HMO | Attending: Internal Medicine

## 2021-06-28 ENCOUNTER — Other Ambulatory Visit: Payer: Self-pay

## 2021-06-28 DIAGNOSIS — Z955 Presence of coronary angioplasty implant and graft: Secondary | ICD-10-CM | POA: Diagnosis present

## 2021-06-28 DIAGNOSIS — I252 Old myocardial infarction: Secondary | ICD-10-CM | POA: Diagnosis present

## 2021-06-28 DIAGNOSIS — I214 Non-ST elevation (NSTEMI) myocardial infarction: Secondary | ICD-10-CM

## 2021-06-28 NOTE — Progress Notes (Signed)
Daily Session Note  Patient Details  Name: Kaitlyn Roman MRN: [id number] Date of Birth: 1945/11/25 Referring Provider:   Flowsheet Row Cardiac Rehab from 06/14/2021 in [facility] Cardiac and Pulmonary Rehab  Referring Provider Estell Harpin MD       Encounter Date: 06/28/2021  Check In:  Session Check In - 06/28/21 0948       Check-In   Supervising physician immediately available to respond to emergencies See telemetry face sheet for immediately available ER MD    Location [facility]-Cardiac & Pulmonary Rehab    Staff Present Birdie Sons, MPA, RN;Joseph Tessie Fass, RCP,RRT,BSRT;Amanda Oletta Darter, BA, ACSM CEP, Exercise Physiologist    Virtual Visit No    Medication changes reported     No    Fall or balance concerns reported    No    Warm-up and Cool-down Performed on first and last piece of equipment    Resistance Training Performed Yes    VAD Patient? No    PAD/SET Patient? No      Pain Assessment   Currently in Pain? No/denies                Social History   Tobacco Use  Smoking Status Never  Smokeless Tobacco Never    Goals Met:  Independence with exercise equipment Exercise tolerated well No report of concerns or symptoms today Strength training completed today  Goals Unmet:  Not Applicable  Comments: Pt able to follow exercise prescription today without complaint.  Will continue to monitor for progression.    Dr. Emily Filbert is Medical Director for [place].  Dr. Ottie Glazier is Medical Director for [facility] Pulmonary Rehabilitation.

## 2021-07-05 ENCOUNTER — Other Ambulatory Visit: Payer: Self-pay

## 2021-07-05 DIAGNOSIS — I214 Non-ST elevation (NSTEMI) myocardial infarction: Secondary | ICD-10-CM

## 2021-07-05 DIAGNOSIS — Z955 Presence of coronary angioplasty implant and graft: Secondary | ICD-10-CM

## 2021-07-05 NOTE — Progress Notes (Signed)
Daily Session Note  Patient Details  Name: Kaitlyn Roman MRN: [id number] Date of Birth: 1945/09/21 Referring Provider:   Flowsheet Row Cardiac Rehab from 06/14/2021 in [facility] Cardiac and Pulmonary Rehab  Referring Provider Estell Harpin MD       Encounter Date: 07/05/2021  Check In:  Session Check In - 07/05/21 0935       Check-In   Supervising physician immediately available to respond to emergencies See telemetry face sheet for immediately available ER MD    Location [facility]-Cardiac & Pulmonary Rehab    Staff Present Birdie Sons, MPA, RN;Melissa [place], RDN, Rowe Pavy, BA, ACSM CEP, Exercise Physiologist    Virtual Visit No    Medication changes reported     No    Fall or balance concerns reported    No    Warm-up and Cool-down Performed on first and last piece of equipment    Resistance Training Performed Yes    VAD Patient? No    PAD/SET Patient? No      Pain Assessment   Currently in Pain? No/denies                Social History   Tobacco Use  Smoking Status Never  Smokeless Tobacco Never    Goals Met:  Independence with exercise equipment Exercise tolerated well No report of concerns or symptoms today Strength training completed today  Goals Unmet:  Not Applicable  Comments: Pt able to follow exercise prescription today without complaint.  Will continue to monitor for progression.    Dr. Emily Filbert is Medical Director for [place].  Dr. Ottie Glazier is Medical Director for [facility] Pulmonary Rehabilitation.

## 2021-07-10 ENCOUNTER — Encounter: Payer: Medicare HMO | Admitting: *Deleted

## 2021-07-10 ENCOUNTER — Other Ambulatory Visit: Payer: Self-pay

## 2021-07-10 DIAGNOSIS — Z955 Presence of coronary angioplasty implant and graft: Secondary | ICD-10-CM | POA: Diagnosis not present

## 2021-07-10 DIAGNOSIS — I214 Non-ST elevation (NSTEMI) myocardial infarction: Secondary | ICD-10-CM

## 2021-07-10 NOTE — Progress Notes (Signed)
Cardiac Individual Treatment Plan  Patient Details  Name: Kaitlyn Roman MRN: [id number] Date of Birth: 09/05/1945 Referring Provider:   Flowsheet Row Cardiac Rehab from 06/14/2021 in [facility] Cardiac and Pulmonary Rehab  Referring Provider Estell Harpin MD       Initial Encounter Date:  Flowsheet Row Cardiac Rehab from 06/14/2021 in [facility] Cardiac and Pulmonary Rehab  Date 06/14/21       Visit Diagnosis: NSTEMI (non-ST elevated myocardial infarction) [facility])  Status post coronary artery stent placement  Patient's Home Medications on Admission:  Current Outpatient Medications:    acetaminophen (TYLENOL) 650 MG CR tablet, Take by mouth., Disp: , Rfl:    aspirin 81 MG tablet, Take 81 mg by mouth daily., Disp: , Rfl:    atorvastatin (LIPITOR) 40 MG tablet, take 1 tablet by mouth once daily (Patient not taking: Reported on 06/05/2021), Disp: 90 tablet, Rfl: 1   Cholecalciferol (VITAMIN D-3) 1000 UNITS CAPS, Take 1 capsule by mouth daily., Disp: , Rfl:    clopidogrel (PLAVIX) 75 MG tablet, Take 75 mg by mouth daily., Disp: , Rfl:    Coenzyme Q10 (CO Q 10) 100 MG CAPS, Take 1 capsule by mouth daily. (Patient not taking: Reported on 06/05/2021), Disp: , Rfl:    diphenhydrAMINE (BENADRYL) 25 mg capsule, Take by mouth., Disp: , Rfl:    doxylamine, Sleep, (UNISOM) 25 MG tablet, Take 12.5 mg by mouth at bedtime as needed for sleep.  (Patient not taking: Reported on 06/05/2021), Disp: , Rfl:    fluticasone (FLONASE) 50 MCG/ACT nasal spray, Place 2 sprays into both nostrils daily. (Patient not taking: Reported on 06/05/2021), Disp: 16 g, Rfl: 6   glucosamine-chondroitin 500-400 MG tablet, Take 2 tablets by mouth daily. (Patient not taking: Reported on 06/05/2021), Disp: , Rfl:    losartan (COZAAR) 25 MG tablet, Take 25 mg by mouth daily., Disp: , Rfl:    losartan-hydrochlorothiazide (HYZAAR) 100-12.5 MG tablet, Take 1 tablet by mouth daily. (Patient not taking: Reported on 06/05/2021), Disp: 90 tablet, Rfl: 1    meloxicam (MOBIC) 15 MG tablet, Take 1 tablet (15 mg total) by mouth daily. (Patient not taking: Reported on 06/05/2021), Disp: 90 tablet, Rfl: 1   metoprolol succinate (TOPROL-XL) 25 MG 24 hr tablet, Take 25 mg by mouth daily., Disp: , Rfl:    Multiple Vitamins-Minerals (HAIR SKIN AND NAILS FORMULA PO), Take 1 tablet by mouth daily. (Patient not taking: Reported on 06/05/2021), Disp: , Rfl:    nitroGLYCERIN (NITROSTAT) 0.4 MG SL tablet, Place under the tongue., Disp: , Rfl:    Potassium 99 MG TABS, Take 1 tablet by mouth daily., Disp: , Rfl:    Probiotic Product (PROBIOTIC PO), Take 1 capsule by mouth daily. (Patient not taking: Reported on 06/05/2021), Disp: , Rfl:    rosuvastatin (CRESTOR) 20 MG tablet, Take 20 mg by mouth at bedtime., Disp: , Rfl:   Past Medical History: Past Medical History:  Diagnosis Date   Arthritis    Brain tumor ([place])    Chronic kidney disease    renal cysts   Colitis, infectious April 2013    C dificile colitis, hosp [facility] April 2013 x 2 weeks   Hyperlipidemia    Hypertension    Polymyalgia rheumatica syndrome ([place])    rheumatoloigst Dr Zenia Resides at [place] Use: Social History   Tobacco Use  Smoking Status Never  Smokeless Tobacco Never    Labs: Recent Review Flowsheet Data     Labs for ITP Cardiac  and Pulmonary Rehab Latest Ref Rng & Units 09/22/2014 12/20/2015 06/12/2016 08/17/2016 02/22/2017   Cholestrol 0 - 200 mg/dL 190 - - 250(H) 224(H)   LDLCALC 0 - 99 mg/dL 97 - - 146(H) 124(H)   LDLDIRECT mg/dL - 104.0 125.0 - -   HDL >39.00 mg/dL 72.40 - - 82.90 81.90   Trlycerides 0.0 - 149.0 mg/dL 103.0 - - 106.0 92.0        Exercise Target Goals: Exercise Program Goal: Individual exercise prescription set using results from initial 6 min walk test and THRR while considering  patients activity barriers and safety.   Exercise Prescription Goal: Initial exercise prescription builds to 30-45 minutes a day of aerobic activity, 2-3 days per  week.  Home exercise guidelines will be given to patient during program as part of exercise prescription that the participant will acknowledge.   Education: Aerobic Exercise: - Group verbal and visual presentation on the components of exercise prescription. Introduces F.I.T.T principle from ACSM for exercise prescriptions.  Reviews F.I.T.T. principles of aerobic exercise including progression. Written material given at graduation.   Education: Resistance Exercise: - Group verbal and visual presentation on the components of exercise prescription. Introduces F.I.T.T principle from ACSM for exercise prescriptions  Reviews F.I.T.T. principles of resistance exercise including progression. Written material given at graduation.    Education: Exercise & Equipment Safety: - Individual verbal instruction and demonstration of equipment use and safety with use of the equipment. Flowsheet Row Cardiac Rehab from 07/05/2021 in [facility] Cardiac and Pulmonary Rehab  Education need identified 06/14/21  Date 06/14/21  Educator [place]  Instruction Review Code 1- Verbalizes Understanding       Education: Exercise Physiology & General Exercise Guidelines: - Group verbal and written instruction with models to review the exercise physiology of the cardiovascular system and associated critical values. Provides general exercise guidelines with specific guidelines to those with heart or lung disease.  Flowsheet Row Cardiac Rehab from 07/05/2021 in [facility] Cardiac and Pulmonary Rehab  Education need identified 06/14/21       Education: Flexibility, Balance, Mind/Body Relaxation: - Group verbal and visual presentation with interactive activity on the components of exercise prescription. Introduces F.I.T.T principle from ACSM for exercise prescriptions. Reviews F.I.T.T. principles of flexibility and balance exercise training including progression. Also discusses the mind body connection.  Reviews various relaxation techniques to  help reduce and manage stress (i.e. Deep breathing, progressive muscle relaxation, and visualization). Balance handout provided to take home. Written material given at graduation.   Activity Barriers & Risk Stratification:  Activity Barriers & Cardiac Risk Stratification - 06/14/21 1156       Activity Barriers & Cardiac Risk Stratification   Activity Barriers Arthritis;Balance Concerns    Cardiac Risk Stratification Moderate             6 Minute Walk:  6 Minute Walk     Row Name 06/14/21 1157         6 Minute Walk   Phase Initial     Distance 1180 feet     Walk Time 6 minutes     # of Rest Breaks 0     MPH 2.23     METS 2.12     RPE 7     Perceived Dyspnea  0     VO2 Peak 7.43     Symptoms No     Resting HR 58 bpm     Resting BP 122/70     Resting Oxygen Saturation  96 %  Exercise Oxygen Saturation  during 6 min walk 99 %     Max Ex. HR 71 bpm     Max Ex. BP 132/72     2 Minute Post BP 112/68              Oxygen Initial Assessment:   Oxygen Re-Evaluation:   Oxygen Discharge (Final Oxygen Re-Evaluation):   Initial Exercise Prescription:  Initial Exercise Prescription - 06/14/21 1200       Date of Initial Exercise RX and Referring Provider   Date 06/14/21    Referring Provider Estell Harpin MD      Oxygen   Maintain Oxygen Saturation 88% or higher      Treadmill   MPH 2.3    Grade 1    Minutes 15    METs 3.08      NuStep   Level 3    SPM 80    Minutes 15    METs 2.12      Elliptical   Level 1    Speed 2.5    Minutes 15    METs 2.1      Prescription Details   Frequency (times per week) 2    Duration Progress to 30 minutes of continuous aerobic without signs/symptoms of physical distress      Intensity   THRR 40-80% of Max Heartrate 92-126    Ratings of Perceived Exertion 11-13    Perceived Dyspnea 0-4      Progression   Progression Continue to progress workloads to maintain intensity without signs/symptoms of physical  distress.      Resistance Training   Training Prescription Yes    Weight 3 lb    Reps 10-15             Perform Capillary Blood Glucose checks as needed.  Exercise Prescription Changes:   Exercise Prescription Changes     Row Name 06/14/21 1200 06/19/21 1200 07/03/21 1400         Response to Exercise   Blood Pressure (Admit) 122/70 122/62 96/60     Blood Pressure (Exercise) 132/72 128/64 110/60     Blood Pressure (Exit) 112/68 120/64 118/70     Heart Rate (Admit) 58 bpm 58 bpm 67 bpm     Heart Rate (Exercise) 71 bpm 85 bpm 78 bpm     Heart Rate (Exit) 56 bpm 60 bpm 59 bpm     Oxygen Saturation (Admit) 96 % -- --     Oxygen Saturation (Exercise) 99 % -- --     Rating of Perceived Exertion (Exercise) 7 12 12      Perceived Dyspnea (Exercise) 0 -- --     Symptoms none none none     Comments walk test results first full day of exercise --     Duration -- Progress to 30 minutes of  aerobic without signs/symptoms of physical distress Continue with 30 min of aerobic exercise without signs/symptoms of physical distress.     Intensity -- THRR unchanged THRR unchanged       Progression   Progression -- Continue to progress workloads to maintain intensity without signs/symptoms of physical distress. Continue to progress workloads to maintain intensity without signs/symptoms of physical distress.     Average METs -- 3.73 2.95       Resistance Training   Training Prescription -- Yes Yes     Weight -- 3 lb 3 lb     Reps -- 10-15 10-15  Interval Training   Interval Training -- No No       Treadmill   MPH -- 2.5 2.6     Grade -- 1 1     Minutes -- 15 15     METs -- 3.26 3.35       NuStep   Level -- -- 5     Minutes -- -- 15     METs -- -- 3.5       REL-XR   Level -- 3 --     Minutes -- 15 --     METs -- 4.2 --       Biostep-RELP   Level -- -- 2     Minutes -- -- 15     METs -- -- 2       Oxygen   Maintain Oxygen Saturation -- 88% or higher 88% or higher               Exercise Comments:   Exercise Goals and Review:   Exercise Goals     Row Name 06/14/21 1221             Exercise Goals   Increase Physical Activity Yes       Intervention Provide advice, education, support and counseling about physical activity/exercise needs.;Develop an individualized exercise prescription for aerobic and resistive training based on initial evaluation findings, risk stratification, comorbidities and participant's personal goals.       Expected Outcomes Short Term: Attend rehab on a regular basis to increase amount of physical activity.;Long Term: Add in home exercise to make exercise part of routine and to increase amount of physical activity.;Long Term: Exercising regularly at least 3-5 days a week.       Increase Strength and Stamina Yes       Intervention Provide advice, education, support and counseling about physical activity/exercise needs.;Develop an individualized exercise prescription for aerobic and resistive training based on initial evaluation findings, risk stratification, comorbidities and participant's personal goals.       Expected Outcomes Short Term: Increase workloads from initial exercise prescription for resistance, speed, and METs.;Short Term: Perform resistance training exercises routinely during rehab and add in resistance training at home;Long Term: Improve cardiorespiratory fitness, muscular endurance and strength as measured by increased METs and functional capacity (6MWT)       Able to understand and use rate of perceived exertion (RPE) scale Yes       Intervention Provide education and explanation on how to use RPE scale       Expected Outcomes Short Term: Able to use RPE daily in rehab to express subjective intensity level;Long Term:  Able to use RPE to guide intensity level when exercising independently       Able to understand and use Dyspnea scale Yes       Intervention Provide education and explanation on how to use  Dyspnea scale       Expected Outcomes Short Term: Able to use Dyspnea scale daily in rehab to express subjective sense of shortness of breath during exertion;Long Term: Able to use Dyspnea scale to guide intensity level when exercising independently       Knowledge and understanding of Target Heart Rate Range (THRR) Yes       Intervention Provide education and explanation of THRR including how the numbers were predicted and where they are located for reference       Expected Outcomes Short Term: Able to state/look up THRR;Long Term: Able to use THRR  to govern intensity when exercising independently;Short Term: Able to use daily as guideline for intensity in rehab       Able to check pulse independently Yes       Intervention Provide education and demonstration on how to check pulse in carotid and radial arteries.;Review the importance of being able to check your own pulse for safety during independent exercise       Expected Outcomes Short Term: Able to explain why pulse checking is important during independent exercise;Long Term: Able to check pulse independently and accurately       Understanding of Exercise Prescription Yes       Intervention Provide education, explanation, and written materials on patient's individual exercise prescription       Expected Outcomes Short Term: Able to explain program exercise prescription;Long Term: Able to explain home exercise prescription to exercise independently                Exercise Goals Re-Evaluation :  Exercise Goals Re-Evaluation     Row Name 06/19/21 1036 07/03/21 1403           Exercise Goal Re-Evaluation   Exercise Goals Review Increase Physical Activity;Able to understand and use rate of perceived exertion (RPE) scale;Knowledge and understanding of Target Heart Rate Range (THRR);Understanding of Exercise Prescription;Increase Strength and Stamina;Able to check pulse independently Increase Physical Activity;Increase Strength and  Stamina;Understanding of Exercise Prescription      Comments Reviewed RPE and dyspnea scales, THR and program prescription with pt today.  Pt voiced understanding and was given a copy of goals to take home. Berneita is off to a good start in rehab.  She has completed her first four full days of exercise.  She is up to level 5 on teh NuStep and 2.6 mph on the treadmill.  We will continue to monitor her progress.      Expected Outcomes Short: Use RPE daily to regulate intensity. Long: Follow program prescription in THR. Short: Attend rehab regularly Long; conitnue to follow program prescription               Discharge Exercise Prescription (Final Exercise Prescription Changes):  Exercise Prescription Changes - 07/03/21 1400       Response to Exercise   Blood Pressure (Admit) 96/60    Blood Pressure (Exercise) 110/60    Blood Pressure (Exit) 118/70    Heart Rate (Admit) 67 bpm    Heart Rate (Exercise) 78 bpm    Heart Rate (Exit) 59 bpm    Rating of Perceived Exertion (Exercise) 12    Symptoms none    Duration Continue with 30 min of aerobic exercise without signs/symptoms of physical distress.    Intensity THRR unchanged      Progression   Progression Continue to progress workloads to maintain intensity without signs/symptoms of physical distress.    Average METs 2.95      Resistance Training   Training Prescription Yes    Weight 3 lb    Reps 10-15      Interval Training   Interval Training No      Treadmill   MPH 2.6    Grade 1    Minutes 15    METs 3.35      NuStep   Level 5    Minutes 15    METs 3.5      Biostep-RELP   Level 2    Minutes 15    METs 2      Oxygen   Maintain  Oxygen Saturation 88% or higher             Nutrition:  Target Goals: Understanding of nutrition guidelines, daily intake of sodium 1500mg , cholesterol 200mg , calories 30% from fat and 7% or less from saturated fats, daily to have 5 or more servings of fruits and  vegetables.  Education: All About Nutrition: -Group instruction provided by verbal, written material, interactive activities, discussions, models, and posters to present general guidelines for heart healthy nutrition including fat, fiber, MyPlate, the role of sodium in heart healthy nutrition, utilization of the nutrition label, and utilization of this knowledge for meal planning. Follow up email sent as well. Written material given at graduation. Flowsheet Row Cardiac Rehab from 07/05/2021 in [facility] Cardiac and Pulmonary Rehab  Education need identified 06/14/21       Biometrics:  Pre Biometrics - 06/14/21 1156       Pre Biometrics   Height 5' 1.2" (1.554 m)    Weight 123 lb 9.6 oz (56.1 kg)    BMI (Calculated) 23.22    Single Leg Stand 7.19 seconds              Nutrition Therapy Plan and Nutrition Goals:  Nutrition Therapy & Goals - 07/10/21 1051       Nutrition Therapy   Diet Heart healthy, low Na    Drug/Food Interactions Statins/Certain Fruits    Protein (specify units) 45g    Fiber 25 grams    Whole Grain Foods 3 servings    Saturated Fats 12 max. grams    Fruits and Vegetables 8 servings/day    Sodium 2 grams      Personal Nutrition Goals   Nutrition Goal No goals as this is Nath' last day (she wanted to be discharged from the program early).    Comments 76 y.o. F admitted to rehab for NSTEMI also presenting with CAD, HTN, DLD. Relevant medications includes vit D, probiotic, crestor, buspar. PYP Score: 61. Vegetables & Fruits 7/12. Breads, Grains & Cereals 10/12. Red & Processed Meat 11/12. Poultry 2/2. Fish & Shellfish 1/4. Beans, Nuts & Seeds 1/4. Milk & Dairy Foods 5/6. Toppings, Oils, Seasonings & Salt 15/20. Sweets, Snacks & Restaurant Food 1/14. Beverages 8/10.  B: granola with raisins and bananas and strawberries with 2% milk. L: can of chicken noodle soup from sons neighbor and sometimes an arbys sandwich, chicken salad sandwich on whole wheat - seldomly eats  potato chips. D: vegetables (squash onions, peppers, potatoes, broccoli, asparagus, lettuce, cucumbers) and grills chicken and pork chops and sometimes steaks. She uses olive oil and roasts many vegetables. Restaurant 1x/week.. Reviewed heart healthy MNT.  No goals as this is [place]' last day (she wanted to be discharged from the program early).      Intervention Plan   Intervention Prescribe, educate and counsel regarding individualized specific dietary modifications aiming towards targeted core components such as weight, hypertension, lipid management, diabetes, heart failure and other comorbidities.    Expected Outcomes Short Term Goal: Understand basic principles of dietary content, such as calories, fat, sodium, cholesterol and nutrients.;Short Term Goal: A plan has been developed with personal nutrition goals set during dietitian appointment.;Long Term Goal: Adherence to prescribed nutrition plan.             Nutrition Assessments:  MEDIFICTS Score Key: ?70 Need to make dietary changes  40-70 Heart Healthy Diet ? 40 Therapeutic Level Cholesterol Diet  Flowsheet Row Cardiac Rehab from 06/14/2021 in [facility] Cardiac and Pulmonary Rehab  Picture Your  Plate Total Score on Admission 61      Picture Your Plate Scores: <81 Unhealthy dietary pattern with much room for improvement. 41-50 Dietary pattern unlikely to meet recommendations for good health and room for improvement. 51-60 More healthful dietary pattern, with some room for improvement.  >60 Healthy dietary pattern, although there may be some specific behaviors that could be improved.    Nutrition Goals Re-Evaluation:  Nutrition Goals Re-Evaluation     [place] Name 06/19/21 1457             Goals   Comment Would like to think about making appointment with RD, will check back in Wednesday 06/21/21.                Nutrition Goals Discharge (Final Nutrition Goals Re-Evaluation):  Nutrition Goals Re-Evaluation - 06/19/21  1457       Goals   Comment Would like to think about making appointment with RD, will check back in Wednesday 06/21/21.             Psychosocial: Target Goals: Acknowledge presence or absence of significant depression and/or stress, maximize coping skills, provide positive support system. Participant is able to verbalize types and ability to use techniques and skills needed for reducing stress and depression.   Education: Stress, Anxiety, and Depression - Group verbal and visual presentation to define topics covered.  Reviews how body is impacted by stress, anxiety, and depression.  Also discusses healthy ways to reduce stress and to treat/manage anxiety and depression.  Written material given at graduation.   Education: Sleep Hygiene -Provides group verbal and written instruction about how sleep can affect your health.  Define sleep hygiene, discuss sleep cycles and impact of sleep habits. Review good sleep hygiene tips.    Initial Review & Psychosocial Screening:  Initial Psych Review & Screening - 06/05/21 1419       Initial Review   Current issues with None Identified      Family Dynamics   Good Support System? Yes   husband     Barriers   Psychosocial barriers to participate in program There are no identifiable barriers or psychosocial needs.;The patient should benefit from training in stress management and relaxation.      Screening Interventions   Interventions Provide feedback about the scores to participant;Encouraged to exercise;To provide support and resources with identified psychosocial needs    Expected Outcomes Short Term goal: Utilizing psychosocial counselor, staff and physician to assist with identification of specific Stressors or current issues interfering with healing process. Setting desired goal for each stressor or current issue identified.;Long Term Goal: Stressors or current issues are controlled or eliminated.;Short Term goal: Identification and review  with participant of any Quality of Life or Depression concerns found by scoring the questionnaire.;Long Term goal: The participant improves quality of Life and PHQ9 Scores as seen by post scores and/or verbalization of changes             Quality of Life Scores:   Quality of Life - 06/14/21 1153       Quality of Life   Select Quality of Life      Quality of Life Scores   Health/Function Pre 28.71 %    Socioeconomic Pre 28.75 %    Psych/Spiritual Pre 30 %    Family Pre 30 %    GLOBAL Pre 29.2 %            Scores of 19 and below usually indicate a poorer quality of  life in these areas.  A difference of  2-3 points is a clinically meaningful difference.  A difference of 2-3 points in the total score of the Quality of Life Index has been associated with significant improvement in overall quality of life, self-image, physical symptoms, and general health in studies assessing change in quality of life.  PHQ-9: Recent Review Flowsheet Data     Depression screen [facility] 2/9 06/14/2021 06/06/2016 09/22/2014 02/24/2013   Decreased Interest 0 0 0 0   Down, Depressed, Hopeless 0 0 0 0   PHQ - 2 Score 0 0 0 0   Altered sleeping 0 - - -   Tired, decreased energy 1 - - -   Change in appetite 0 - - -   Feeling bad or failure about yourself  0 - - -   Trouble concentrating 0 - - -   Moving slowly or fidgety/restless 0 - - -   Suicidal thoughts 0 - - -   PHQ-9 Score 1 - - -   Difficult doing work/chores Not difficult at all - - -      Interpretation of Total Score  Total Score Depression Severity:  1-4 = Minimal depression, 5-9 = Mild depression, 10-14 = Moderate depression, 15-19 = Moderately severe depression, 20-27 = Severe depression   Psychosocial Evaluation and Intervention:  Psychosocial Evaluation - 06/05/21 1423       Psychosocial Evaluation & Interventions   Interventions Encouraged to exercise with the program and follow exercise prescription    Comments Mahlani reports doing  well post MI and stent. She does state her stamina is still sluggish. Her husband helps manage her health care and plans to be involved. She does not report any current stressors. 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 She does have a $45 copay so she is planning on doing the program short term. She is already exercising on her own and is excited to learn more about heart healthy living.    Expected Outcomes Short: attend cardiac rehab for education and exercise. Long: develop and maintain positive self care habits.    Continue Psychosocial Services  Follow up required by staff             Psychosocial Re-Evaluation:   Psychosocial Discharge (Final Psychosocial Re-Evaluation):   Vocational Rehabilitation: Provide vocational rehab assistance to qualifying candidates.   Vocational Rehab Evaluation & Intervention:  Vocational Rehab - 06/05/21 1419       Initial Vocational Rehab Evaluation & Intervention   Assessment shows need for Vocational Rehabilitation No             Education: Education Goals: Education classes will be provided on a variety of topics geared toward better understanding of heart health and risk factor modification. Participant will state understanding/return demonstration of topics presented as noted by education test scores.  Learning Barriers/Preferences:  Learning Barriers/Preferences - 06/05/21 1409       Learning Barriers/Preferences   Learning Barriers Hearing   deaf in left ear   Learning Preferences None             General Cardiac Education Topics:  AED/CPR: - Group verbal and written instruction with the use of models to demonstrate the basic use of the AED with the basic ABC's of resuscitation.   Anatomy and Cardiac Procedures: - Group verbal  and visual presentation and models provide information about  basic cardiac anatomy and function. Reviews the testing methods done to diagnose heart disease and the outcomes of the test results. Describes the treatment choices: Medical Management, Angioplasty, or Coronary Bypass Surgery for treating various heart conditions including Myocardial Infarction, Angina, Valve Disease, and Cardiac Arrhythmias.  Written material given at graduation.   Medication Safety: - Group verbal and visual instruction to review commonly prescribed medications for heart and lung disease. Reviews the medication, class of the drug, and side effects. Includes the steps to properly store meds and maintain the prescription regimen.  Written material given at graduation.   Intimacy: - Group verbal instruction through game format to discuss how heart and lung disease can affect sexual intimacy. Written material given at graduation..   Know Your Numbers and Heart Failure: - Group verbal and visual instruction to discuss disease risk factors for cardiac and pulmonary disease and treatment options.  Reviews associated critical values for Overweight/Obesity, Hypertension, Cholesterol, and Diabetes.  Discusses basics of heart failure: signs/symptoms and treatments.  Introduces Heart Failure Zone chart for action plan for heart failure.  Written material given at graduation. Flowsheet Row Cardiac Rehab from 07/05/2021 in [facility] Cardiac and Pulmonary Rehab  Date 06/28/21  Educator SB  Instruction Review Code 1- Verbalizes Understanding       Infection Prevention: - Provides verbal and written material to individual with discussion of infection control including proper hand washing and proper equipment cleaning during exercise session. Flowsheet Row Cardiac Rehab from 07/05/2021 in [facility] Cardiac and Pulmonary Rehab  Education need identified 06/14/21  Date 06/14/21  Educator [place]  Instruction Review Code 1- Verbalizes Understanding       Falls Prevention: - Provides verbal and written  material to individual with discussion of falls prevention and safety. Flowsheet Row Cardiac Rehab from 07/05/2021 in [facility] Cardiac and Pulmonary Rehab  Education need identified 06/14/21  Date 06/14/21  Educator [place]  Instruction Review Code 1- Verbalizes Understanding       Other: -Provides group and verbal instruction on various topics (see comments)   Knowledge Questionnaire Score:  Knowledge Questionnaire Score - 06/14/21 1151       Knowledge Questionnaire Score   Pre Score 24/26: Nutrition, Exercise             Core Components/Risk Factors/Patient Goals at Admission:  Personal Goals and Risk Factors at Admission - 06/14/21 1221       Core Components/Risk Factors/Patient Goals on Admission    Weight Management Yes;Weight Maintenance    Intervention Weight Management: Develop a combined nutrition and exercise program designed to reach desired caloric intake, while maintaining appropriate intake of nutrient and fiber, sodium and fats, and appropriate energy expenditure required for the weight goal.;Weight Management: Provide education and appropriate resources to help participant work on and attain dietary goals.;Weight Management/Obesity: Establish reasonable short term and long term weight goals.    Admit Weight 123 lb (55.8 kg)    Goal Weight: Short Term 123 lb (55.8 kg)    Goal Weight: Long Term 123 lb (55.8 kg)    Expected Outcomes Short Term: Continue to assess and modify interventions until short term weight is achieved;Long Term: Adherence to nutrition and physical activity/exercise program aimed toward attainment of established weight goal;Weight Maintenance: Understanding of the daily nutrition guidelines, which includes 25-35% calories from fat, 7% or less cal from saturated fats, less than 200mg  cholesterol, less than 1.5gm of sodium, & 5 or more servings of fruits and  vegetables daily;Understanding recommendations for meals to include 15-35% energy as protein, 25-35%  energy from fat, 35-60% energy from carbohydrates, less than 200mg  of dietary cholesterol, 20-35 gm of total fiber daily;Understanding of distribution of calorie intake throughout the day with the consumption of 4-5 meals/snacks    Hypertension Yes    Intervention Provide education on lifestyle modifcations including regular physical activity/exercise, weight management, moderate sodium restriction and increased consumption of fresh fruit, vegetables, and low fat dairy, alcohol moderation, and smoking cessation.;Monitor prescription use compliance.    Expected Outcomes Short Term: Continued assessment and intervention until BP is < 140/21mm HG in hypertensive participants. < 130/4mm HG in hypertensive participants with diabetes, heart failure or chronic kidney disease.;Long Term: Maintenance of blood pressure at goal levels.    Lipids Yes    Intervention Provide education and support for participant on nutrition & aerobic/resistive exercise along with prescribed medications to achieve LDL 70mg , HDL >40mg .    Expected Outcomes Short Term: Participant states understanding of desired cholesterol values and is compliant with medications prescribed. Participant is following exercise prescription and nutrition guidelines.;Long Term: Cholesterol controlled with medications as prescribed, with individualized exercise RX and with personalized nutrition plan. Value goals: LDL < 70mg , HDL > 40 mg.             Education:Diabetes - Individual verbal and written instruction to review signs/symptoms of diabetes, desired ranges of glucose level fasting, after meals and with exercise. Acknowledge that pre and post exercise glucose checks will be done for 3 sessions at entry of program.   Core Components/Risk Factors/Patient Goals Review:    Core Components/Risk Factors/Patient Goals at Discharge (Final Review):    ITP Comments:  ITP Comments     Row Name 06/05/21 1433 06/14/21 1150 06/19/21 1035 06/21/21  0910 07/10/21 1033   ITP Comments Initial telephone orientation completed. Diagnosis can be found in [facility] 11/30. EP orientation scheduled for Wednesday 1/18 at 10:30. Completed 6MWT and gym orientation. Initial ITP created and sent for review to Dr. Emily Filbert,  Medical Director. First full day of exercise!  Patient was oriented to gym and equipment including functions, settings, policies, and procedures.  Patient's individual exercise prescription and treatment plan were reviewed.  All starting workloads were established based on the results of the 6 minute walk test done at initial orientation visit.  The plan for exercise progression was also introduced and progression will be customized based on patient's performance and goals. 30 Day review completed. Medical Director ITP review done, changes made as directed, and signed approval by Medical Director.   new to program Latise graduated today from  rehab with 9 sessions completed.  Details of the patient's exercise prescription and what She needs to do in order to continue the prescription and progress were discussed with patient.  Patient was given a copy of prescription and goals.  Patient verbalized understanding.  Tiwana plans to continue to exercise by walking.    [place] Name 07/10/21 1121           ITP Comments Completed initial RD consultation                Comments: Discharge ITP

## 2021-07-10 NOTE — Progress Notes (Signed)
Discharge Progress Report  Patient Details  Name: Kaitlyn Roman MRN: [id number] Date of Birth: 03/25/1946 Referring Provider:   Flowsheet Row Cardiac Rehab from 06/14/2021 in [facility] Cardiac and Pulmonary Rehab  Referring Provider Estell Harpin MD        Number of Visits: 9  Reason for Discharge:  Patient reached a stable level of exercise. Early Exit:  Personal  Smoking History:  Social History   Tobacco Use  Smoking Status Never  Smokeless Tobacco Never    Diagnosis:  NSTEMI (non-ST elevated myocardial infarction) ([place])  Status post coronary artery stent placement  ADL UCSD:   Initial Exercise Prescription:  Initial Exercise Prescription - 06/14/21 1200       Date of Initial Exercise RX and Referring Provider   Date 06/14/21    Referring Provider Estell Harpin MD      Oxygen   Maintain Oxygen Saturation 88% or higher      Treadmill   MPH 2.3    Grade 1    Minutes 15    METs 3.08      NuStep   Level 3    SPM 80    Minutes 15    METs 2.12      Elliptical   Level 1    Speed 2.5    Minutes 15    METs 2.1      Prescription Details   Frequency (times per week) 2    Duration Progress to 30 minutes of continuous aerobic without signs/symptoms of physical distress      Intensity   THRR 40-80% of Max Heartrate 92-126    Ratings of Perceived Exertion 11-13    Perceived Dyspnea 0-4      Progression   Progression Continue to progress workloads to maintain intensity without signs/symptoms of physical distress.      Resistance Training   Training Prescription Yes    Weight 3 lb    Reps 10-15             Discharge Exercise Prescription (Final Exercise Prescription Changes):  Exercise Prescription Changes - 07/03/21 1400       Response to Exercise   Blood Pressure (Admit) 96/60    Blood Pressure (Exercise) 110/60    Blood Pressure (Exit) 118/70    Heart Rate (Admit) 67 bpm    Heart Rate (Exercise) 78 bpm    Heart Rate (Exit) 59 bpm     Rating of Perceived Exertion (Exercise) 12    Symptoms none    Duration Continue with 30 min of aerobic exercise without signs/symptoms of physical distress.    Intensity THRR unchanged      Progression   Progression Continue to progress workloads to maintain intensity without signs/symptoms of physical distress.    Average METs 2.95      Resistance Training   Training Prescription Yes    Weight 3 lb    Reps 10-15      Interval Training   Interval Training No      Treadmill   MPH 2.6    Grade 1    Minutes 15    METs 3.35      NuStep   Level 5    Minutes 15    METs 3.5      Biostep-RELP   Level 2    Minutes 15    METs 2      Oxygen   Maintain Oxygen Saturation 88% or higher  Functional Capacity:  6 Minute Walk     Row Name 06/14/21 1157         6 Minute Walk   Phase Initial     Distance 1180 feet     Walk Time 6 minutes     # of Rest Breaks 0     MPH 2.23     METS 2.12     RPE 7     Perceived Dyspnea  0     VO2 Peak 7.43     Symptoms No     Resting HR 58 bpm     Resting BP 122/70     Resting Oxygen Saturation  96 %     Exercise Oxygen Saturation  during 6 min walk 99 %     Max Ex. HR 71 bpm     Max Ex. BP 132/72     2 Minute Post BP 112/68              Psychological, QOL, Others - Outcomes: PHQ 2/9: Depression screen [facility] 2/9 06/14/2021 06/06/2016 09/22/2014 02/24/2013  Decreased Interest 0 0 0 0  Down, Depressed, Hopeless 0 0 0 0  PHQ - 2 Score 0 0 0 0  Altered sleeping 0 - - -  Tired, decreased energy 1 - - -  Change in appetite 0 - - -  Feeling bad or failure about yourself  0 - - -  Trouble concentrating 0 - - -  Moving slowly or fidgety/restless 0 - - -  Suicidal thoughts 0 - - -  PHQ-9 Score 1 - - -  Difficult doing work/chores Not difficult at all - - -    Quality of Life:  Quality of Life - 06/14/21 1153       Quality of Life   Select Quality of Life      Quality of Life Scores   Health/Function Pre 28.71  %    Socioeconomic Pre 28.75 %    Psych/Spiritual Pre 30 %    Family Pre 30 %    GLOBAL Pre 29.2 %              Nutrition & Weight - Outcomes:  Pre Biometrics - 06/14/21 1156       Pre Biometrics   Height 5' 1.2" (1.554 m)    Weight 123 lb 9.6 oz (56.1 kg)    BMI (Calculated) 23.22    Single Leg Stand 7.19 seconds              Nutrition:  Nutrition Therapy & Goals - 07/10/21 1051       Nutrition Therapy   Diet Heart healthy, low Na    Drug/Food Interactions Statins/Certain Fruits    Protein (specify units) 45g    Fiber 25 grams    Whole Grain Foods 3 servings    Saturated Fats 12 max. grams    Fruits and Vegetables 8 servings/day    Sodium 2 grams      Personal Nutrition Goals   Nutrition Goal No goals as this is Rutan' last day (she wanted to be discharged from the program early).    Comments 76 y.o. F admitted to rehab for NSTEMI also presenting with CAD, HTN, DLD. Relevant medications includes vit D, probiotic, crestor, buspar. PYP Score: 61. Vegetables & Fruits 7/12. Breads, Grains & Cereals 10/12. Red & Processed Meat 11/12. Poultry 2/2. Fish & Shellfish 1/4. Beans, Nuts & Seeds 1/4. Milk & Dairy Foods 5/6. Toppings, Oils, Seasonings & Salt  15/20. Sweets, Snacks & Restaurant Food 1/14. Beverages 8/10.  B: granola with raisins and bananas and strawberries with 2% milk. L: can of chicken noodle soup from sons neighbor and sometimes an arbys sandwich, chicken salad sandwich on whole wheat - seldomly eats potato chips. D: vegetables (squash onions, peppers, potatoes, broccoli, asparagus, lettuce, cucumbers) and grills chicken and pork chops and sometimes steaks. She uses olive oil and roasts many vegetables. Restaurant 1x/week.. Reviewed heart healthy MNT.  No goals as this is Filsaime' last day (she wanted to be discharged from the program early).      Intervention Plan   Intervention Prescribe, educate and counsel regarding individualized specific dietary  modifications aiming towards targeted core components such as weight, hypertension, lipid management, diabetes, heart failure and other comorbidities.    Expected Outcomes Short Term Goal: Understand basic principles of dietary content, such as calories, fat, sodium, cholesterol and nutrients.;Short Term Goal: A plan has been developed with personal nutrition goals set during dietitian appointment.;Long Term Goal: Adherence to prescribed nutrition plan.              Education Questionnaire Score:  Knowledge Questionnaire Score - 06/14/21 1151       Knowledge Questionnaire Score   Pre Score 24/26: Nutrition, Exercise             Goals reviewed with patient; copy given to patient.

## 2021-07-10 NOTE — Progress Notes (Signed)
Daily Session Note  Patient Details  Name: Kaitlyn Roman MRN: [id number] Date of Birth: [birth year] Referring Provider:   Flowsheet Row Cardiac Rehab from 06/14/2021 in [facility] Cardiac and Pulmonary Rehab  Referring Provider Estell Harpin MD       Encounter Date: 07/10/2021  Check In:  Session Check In - 07/10/21 1032       Check-In   Supervising physician immediately available to respond to emergencies See telemetry face sheet for immediately available ER MD    Location [facility]-Cardiac & Pulmonary Rehab    Staff Present Renita Papa, RN Vickki Hearing, BA, ACSM CEP, Exercise Physiologist;Kelly Amedeo Plenty, BS, ACSM CEP, Exercise Physiologist    Virtual Visit No    Medication changes reported     No    Fall or balance concerns reported    No    Warm-up and Cool-down Performed on first and last piece of equipment    Resistance Training Performed Yes    VAD Patient? No    PAD/SET Patient? No      Pain Assessment   Currently in Pain? No/denies                Social History   Tobacco Use  Smoking Status Never  Smokeless Tobacco Never    Goals Met:  Independence with exercise equipment Exercise tolerated well No report of concerns or symptoms today Strength training completed today  Goals Unmet:  Not Applicable  Comments:  Starlett graduated today from  rehab with 9 sessions completed.  Details of the patient's exercise prescription and what She needs to do in order to continue the prescription and progress were discussed with patient.  Patient was given a copy of prescription and goals.  Patient verbalized understanding.  Zamia plans to continue to exercise by walking.    Dr. Emily Filbert is Medical Director for [place].  Dr. Ottie Glazier is Medical Director for [facility] Pulmonary Rehabilitation.

## 2021-07-10 NOTE — Progress Notes (Signed)
Completed initial RD consultation ?

## 2022-07-02 ENCOUNTER — Ambulatory Visit: Admit: 2022-07-02 | Payer: Medicare HMO

## 2022-07-02 SURGERY — COLONOSCOPY WITH PROPOFOL
Anesthesia: General

## 2022-09-28 ENCOUNTER — Encounter: Payer: Self-pay | Admitting: *Deleted

## 2022-10-01 ENCOUNTER — Ambulatory Visit: Payer: Medicare HMO | Admitting: Anesthesiology

## 2022-10-01 ENCOUNTER — Encounter
Admission: RE | Disposition: A | Discharge status: Home / Self Care | Payer: Self-pay | Source: Home / Self Care | Attending: Gastroenterology

## 2022-10-01 ENCOUNTER — Encounter: Payer: Self-pay | Admitting: *Deleted

## 2022-10-01 ENCOUNTER — Ambulatory Visit
Admission: RE | Admit: 2022-10-01 | Discharge: 2022-10-01 | Disposition: A | Discharge status: Home / Self Care | Payer: Medicare HMO | Attending: Gastroenterology | Admitting: Gastroenterology

## 2022-10-01 DIAGNOSIS — M353 Polymyalgia rheumatica: Secondary | ICD-10-CM | POA: Diagnosis not present

## 2022-10-01 DIAGNOSIS — I252 Old myocardial infarction: Secondary | ICD-10-CM | POA: Diagnosis not present

## 2022-10-01 DIAGNOSIS — R194 Change in bowel habit: Secondary | ICD-10-CM | POA: Diagnosis present

## 2022-10-01 DIAGNOSIS — E785 Hyperlipidemia, unspecified: Secondary | ICD-10-CM | POA: Insufficient documentation

## 2022-10-01 DIAGNOSIS — I129 Hypertensive chronic kidney disease with stage 1 through stage 4 chronic kidney disease, or unspecified chronic kidney disease: Secondary | ICD-10-CM | POA: Insufficient documentation

## 2022-10-01 DIAGNOSIS — N189 Chronic kidney disease, unspecified: Secondary | ICD-10-CM | POA: Diagnosis not present

## 2022-10-01 DIAGNOSIS — K5730 Diverticulosis of large intestine without perforation or abscess without bleeding: Secondary | ICD-10-CM | POA: Insufficient documentation

## 2022-10-01 DIAGNOSIS — M1990 Unspecified osteoarthritis, unspecified site: Secondary | ICD-10-CM | POA: Insufficient documentation

## 2022-10-01 HISTORY — PX: COLONOSCOPY WITH PROPOFOL: SHX5780

## 2022-10-01 SURGERY — COLONOSCOPY WITH PROPOFOL
Anesthesia: General

## 2022-10-01 MED ORDER — PROPOFOL 10 MG/ML IV BOLUS
INTRAVENOUS | Status: DC | PRN
  Administered 2022-10-01: 20 mg via INTRAVENOUS
  Administered 2022-10-01: 50 mg via INTRAVENOUS

## 2022-10-01 MED ORDER — SODIUM CHLORIDE 0.9 % IV SOLN: INTRAVENOUS | Status: DC

## 2022-10-01 MED ORDER — SODIUM CHLORIDE 0.9 % IV SOLN: INTRAVENOUS | Status: DC | PRN

## 2022-10-01 MED ORDER — LIDOCAINE HCL (CARDIAC) PF 100 MG/5ML IV SOSY
PREFILLED_SYRINGE | INTRAVENOUS | Status: DC | PRN
  Administered 2022-10-01: 100 mg via INTRAVENOUS

## 2022-10-01 MED ORDER — PROPOFOL 500 MG/50ML IV EMUL
INTRAVENOUS | Status: DC | PRN
  Administered 2022-10-01: 155 ug/kg/min via INTRAVENOUS

## 2022-10-01 NOTE — Anesthesia Preprocedure Evaluation (Signed)
Anesthesia Evaluation  Patient identified by MRN, date of birth, ID band Patient awake    Reviewed: Allergy & Precautions, NPO status , Patient's Chart, lab work & pertinent test results  History of Anesthesia Complications Negative for: history of anesthetic complications  Airway Mallampati: III  TM Distance: <3 FB Neck ROM: full    Dental  (+) Chipped   Pulmonary neg pulmonary ROS, neg shortness of breath   Pulmonary exam normal        Cardiovascular Exercise Tolerance: Good hypertension, + Past MI  Normal cardiovascular exam     Neuro/Psych negative neurological ROS  negative psych ROS   GI/Hepatic negative GI ROS, Neg liver ROS,neg GERD  ,,  Endo/Other  negative endocrine ROS    Renal/GU Renal disease  negative genitourinary   Musculoskeletal   Abdominal   Peds  Hematology negative hematology ROS (+)   Anesthesia Other Findings Past Medical History: No date: Arthritis No date: Brain tumor (HCC)     Comment:  accustic neuralgia No date: Chronic kidney disease     Comment:  renal cysts 08/2011: Colitis, infectious     Comment:   C dificile colitis, hosp [facility] April 2013 x 2 weeks No date: Hyperlipidemia No date: Hypertension 2021: Myocardial infarction (HCC) No date: Polymyalgia rheumatica syndrome (HCC)     Comment:  rheumatoloigst Dr Freida Busman at [facility]  Past Surgical History: No date: ABDOMINAL HYSTERECTOMY 2021: CARDIAC CATHETERIZATION     Comment:  stents x2 No date: COLONOSCOPY 09/09/2017: COLONOSCOPY WITH PROPOFOL; N/A     Comment:  Procedure: COLONOSCOPY WITH PROPOFOL;  Surgeon: Scot Jun, MD;  Location: [facility] END[facility]COPY;  Service:               Endoscopy;  Laterality: N/A; No date: CRANIECTOMY FOR EXCISION OF ACOUSTIC NEUROMA  BMI    Body Mass Index: 23.23 kg/m      Reproductive/Obstetrics negative OB ROS                              Anesthesia Physical Anesthesia Plan  ASA: 3  Anesthesia Plan: General   Post-op Pain Management:    Induction: Intravenous  PONV Risk Score and Plan: Propofol infusion and TIVA  Airway Management Planned: Natural Airway and Nasal Cannula  Additional Equipment:   Intra-op Plan:   Post-operative Plan:   Informed Consent: I have reviewed the patients History and Physical, chart, labs and discussed the procedure including the risks, benefits and alternatives for the proposed anesthesia with the patient or authorized representative who has indicated his/her understanding and acceptance.     Dental Advisory Given  Plan Discussed with: Anesthesiologist, CRNA and Surgeon  Anesthesia Plan Comments: (Patient consented for risks of anesthesia including but not limited to:  - adverse reactions to medications - risk of airway placement if required - damage to eyes, teeth, lips or other oral mucosa - nerve damage due to positioning  - sore throat or hoarseness - Damage to heart, brain, nerves, lungs, other parts of body or loss of life  Patient voiced understanding.)       Anesthesia Quick Evaluation

## 2022-10-01 NOTE — H&P (Signed)
Outpatient short stay form Pre-procedure 10/01/2022  Regis Bill, MD  Primary Physician: Tessie Fass, MD  Reason for visit:  Loose stools  History of present illness:    77 y/o lady with history of hypertension, CAD on plavix (last dose 6 days ago), and HLD here for colonoscopy due to loose stools. Last colonoscopy in 2019 with small polyp (polyp not recovered). No significant abdominal surgeries. No family history of GI malignancies.    Current Facility-Administered Medications:    0.9 %  sodium chloride infusion, , Intravenous, Continuous, Lavaya Defreitas, Rossie Muskrat, MD  Medications Prior to Admission  Medication Sig Dispense Refill Last Dose   losartan (COZAAR) 25 MG tablet Take 25 mg by mouth daily.   10/01/2022   acetaminophen (TYLENOL) 650 MG CR tablet Take by mouth.      aspirin 81 MG tablet Take 81 mg by mouth daily.      atorvastatin (LIPITOR) 40 MG tablet take 1 tablet by mouth once daily (Patient not taking: Reported on 06/05/2021) 90 tablet 1    Cholecalciferol (VITAMIN D-3) 1000 UNITS CAPS Take 1 capsule by mouth daily.      clopidogrel (PLAVIX) 75 MG tablet Take 75 mg by mouth daily.   09/25/2022   Coenzyme Q10 (CO Q 10) 100 MG CAPS Take 1 capsule by mouth daily. (Patient not taking: Reported on 06/05/2021)      diphenhydrAMINE (BENADRYL) 25 mg capsule Take by mouth.      doxylamine, Sleep, (UNISOM) 25 MG tablet Take 12.5 mg by mouth at bedtime as needed for sleep.  (Patient not taking: Reported on 06/05/2021)      fluticasone (FLONASE) 50 MCG/ACT nasal spray Place 2 sprays into both nostrils daily. (Patient not taking: Reported on 06/05/2021) 16 g 6    glucosamine-chondroitin 500-400 MG tablet Take 2 tablets by mouth daily. (Patient not taking: Reported on 06/05/2021)      losartan-hydrochlorothiazide (HYZAAR) 100-12.5 MG tablet Take 1 tablet by mouth daily. 90 tablet 1    meloxicam (MOBIC) 15 MG tablet Take 1 tablet (15 mg total) by mouth daily. (Patient not taking:  Reported on 06/05/2021) 90 tablet 1    metoprolol succinate (TOPROL-XL) 25 MG 24 hr tablet Take 25 mg by mouth daily.   09/29/2022   Multiple Vitamins-Minerals (HAIR SKIN AND NAILS FORMULA PO) Take 1 tablet by mouth daily. (Patient not taking: Reported on 06/05/2021)      nitroGLYCERIN (NITROSTAT) 0.4 MG SL tablet Place under the tongue.      Potassium 99 MG TABS Take 1 tablet by mouth daily.      Probiotic Product (PROBIOTIC PO) Take 1 capsule by mouth daily. (Patient not taking: Reported on 06/05/2021)      rosuvastatin (CRESTOR) 20 MG tablet Take 20 mg by mouth at bedtime.        Allergies  Allergen Reactions   Iodine Swelling     Past Medical History:  Diagnosis Date   Arthritis    Brain tumor [facility])    accustic neuralgia   Chronic kidney disease    renal cysts   Colitis, infectious 08/2011    C dificile colitis, hosp [facility] April 2013 x 2 weeks   Hyperlipidemia    Hypertension    Myocardial infarction [facility]) 2021   Polymyalgia rheumatica syndrome (HCC)    rheumatoloigst Dr Freida Busman at [facility]    Review of systems:  Otherwise negative.    Physical Exam  Gen: Alert, oriented. Appears stated age.  HEENT: PERRLA. Lungs:  No respiratory distress CV: RRR Abd: soft, benign, no masses Ext: No edema    Planned procedures: Proceed with colonoscopy. The patient understands the nature of the planned procedure, indications, risks, alternatives and potential complications including but not limited to bleeding, infection, perforation, damage to internal organs and possible oversedation/side effects from anesthesia. The patient agrees and gives consent to proceed.  Please refer to procedure notes for findings, recommendations and patient disposition/instructions.     Regis Bill, MD [facility]

## 2022-10-01 NOTE — Anesthesia Procedure Notes (Signed)
Procedure Name: General with mask airway Date/Time: 10/01/2022 11:20 AM  Performed by: Mohammed Kindle, CRNAPre-anesthesia Checklist: Emergency Drugs available, Patient identified, Suction available and Patient being monitored Patient Re-evaluated:Patient Re-evaluated prior to induction Oxygen Delivery Method: Simple face mask Induction Type: IV induction Placement Confirmation: positive ETCO2, CO2 detector and breath sounds checked- equal and bilateral Dental Injury: Teeth and Oropharynx as per pre-operative assessment

## 2022-10-01 NOTE — Interval H&P Note (Signed)
History and Physical Interval Note:  10/01/2022 11:12 AM  Kaitlyn Roman  has presented today for surgery, with the diagnosis of Loose stools.  The various methods of treatment have been discussed with the patient and family. After consideration of risks, benefits and other options for treatment, the patient has consented to  Procedure(s): COLONOSCOPY WITH PROPOFOL (N/A) as a surgical intervention.  The patient's history has been reviewed, patient examined, no change in status, stable for surgery.  I have reviewed the patient's chart and labs.  Questions were answered to the patient's satisfaction.     Regis Bill  Ok to proceed with colonoscopy

## 2022-10-01 NOTE — Anesthesia Postprocedure Evaluation (Signed)
Anesthesia Post Note  Patient: Kaitlyn Roman  Procedure(s) Performed: COLONOSCOPY WITH PROPOFOL  Patient location during evaluation: Endoscopy Anesthesia Type: General Level of consciousness: awake and alert Pain management: pain level controlled Vital Signs Assessment: post-procedure vital signs reviewed and stable Respiratory status: spontaneous breathing, nonlabored ventilation, respiratory function stable and patient connected to nasal cannula oxygen Cardiovascular status: blood pressure returned to baseline and stable Postop Assessment: no apparent nausea or vomiting Anesthetic complications: no   No notable events documented.   Last Vitals:  Vitals:   10/01/22 1136 10/01/22 1143  BP: 103/69 116/86  Pulse:    Resp:    Temp: (!) 36.2 C   SpO2:      Last Pain:  Vitals:   10/01/22 1200  TempSrc:   PainSc: 0-No pain                 Cleda Mccreedy Patrycja Mumpower

## 2022-10-01 NOTE — Op Note (Signed)
[facility] Gastroenterology Patient Name: Kaitlyn Roman Procedure Date: 10/01/2022 10:48 AM MRN: [id number] Account #: [id number] Date of Birth: [birth year] Admit Type: Outpatient Age: 77 Room: [facility] ENDO ROOM 3 Gender: Female Note Status: Finalized Instrument Name: Prentice Docker [id number] Procedure:             Colonoscopy Indications:           Change in bowel habits Providers:             Eather Colas MD, MD Referring MD:          Doran Stabler, MD (Referring MD) Medicines:             Monitored Anesthesia Care Complications:         No immediate complications. Estimated blood loss:                         Minimal. Procedure:             Pre-Anesthesia Assessment:                        - Prior to the procedure, a History and Physical was                         performed, and patient medications and allergies were                         reviewed. The patient is competent. The risks and                         benefits of the procedure and the sedation options and                         risks were discussed with the patient. All questions                         were answered and informed consent was obtained.                         Patient identification and proposed procedure were                         verified by the physician, the nurse, the                         anesthesiologist, the anesthetist and the technician                         in the endoscopy suite. Mental Status Examination:                         alert and oriented. Airway Examination: normal                         oropharyngeal airway and neck mobility. Respiratory                         Examination: clear to auscultation. CV Examination:  normal. Prophylactic Antibiotics: The patient does not                         require prophylactic antibiotics. Prior                         Anticoagulants: The patient has taken Plavix                          (clopidogrel), last dose was 6 days prior to                         procedure. ASA Grade Assessment: III - A patient with                         severe systemic disease. After reviewing the risks and                         benefits, the patient was deemed in satisfactory                         condition to undergo the procedure. The anesthesia                         plan was to use monitored anesthesia care (MAC).                         Immediately prior to administration of medications,                         the patient was re-assessed for adequacy to receive                         sedatives. The heart rate, respiratory rate, oxygen                         saturations, blood pressure, adequacy of pulmonary                         ventilation, and response to care were monitored                         throughout the procedure. The physical status of the                         patient was re-assessed after the procedure.                        After obtaining informed consent, the colonoscope was                         passed under direct vision. Throughout the procedure,                         the patient's blood pressure, pulse, and oxygen                         saturations were monitored continuously. The  Colonoscope was introduced through the anus and                         advanced to the the terminal ileum. The colonoscopy                         was somewhat difficult due to significant looping.                         Successful completion of the procedure was aided by                         applying abdominal pressure. The patient tolerated the                         procedure well. The quality of the bowel preparation                         was good. The terminal ileum, ileocecal valve,                         appendiceal orifice, and rectum were photographed. Findings:      The perianal and digital rectal examinations were normal.       The terminal ileum appeared normal.      Normal mucosa was found in the entire colon. Biopsies for histology were       taken with a cold forceps from the entire colon for evaluation of       microscopic colitis. Estimated blood loss was minimal.      Scattered small-mouthed diverticula were found in the sigmoid colon.      The exam was otherwise without abnormality on direct and retroflexion       views. Impression:            - The examined portion of the ileum was normal.                        - Normal mucosa in the entire examined colon. Biopsied.                        - Diverticulosis in the sigmoid colon.                        - The examination was otherwise normal on direct and                         retroflexion views. Recommendation:        - Discharge patient to home.                        - Resume previous diet.                        - Resume Plavix (clopidogrel) at prior dose today.                        - Await pathology results.                        - Repeat colonoscopy is  not recommended due to current                         age (49 years or older) for surveillance.                        - Return to referring physician as previously                         scheduled. Procedure Code(s):     --- Professional ---                        [phone], Colonoscopy, flexible; with biopsy, single or                         multiple Diagnosis Code(s):     --- Professional ---                        R19.4, Change in bowel habit                        K57.30, Diverticulosis of large intestine without                         perforation or abscess without bleeding CPT copyright 2022 [employer]. All rights reserved. The codes documented in this report are preliminary and upon coder review may  be revised to meet current compliance requirements. Eather Colas MD, MD 10/01/2022 11:43:12 AM Number of Addenda: 0 Note Initiated On: 10/01/2022 10:48 AM Scope  Withdrawal Time: 0 hours 8 minutes 57 seconds  Total Procedure Duration: 0 hours 14 minutes 24 seconds  Estimated Blood Loss:  Estimated blood loss was minimal.      [facility]

## 2022-10-01 NOTE — Transfer of Care (Signed)
Immediate Anesthesia Transfer of Care Note  Patient: Kaitlyn Roman  Procedure(s) Performed: COLONOSCOPY WITH PROPOFOL  Patient Location: Endoscopy Unit  Anesthesia Type:General  Level of Consciousness: drowsy and patient cooperative  Airway & Oxygen Therapy: Patient Spontanous Breathing and Patient connected to face mask oxygen  Post-op Assessment: Report given to RN and Post -op Vital signs reviewed and stable  Post vital signs: Reviewed and stable  Last Vitals:  Vitals Value Taken Time  BP 94/63 10/01/22 1135  Temp    Pulse 81 10/01/22 1136  Resp 15 10/01/22 1136  SpO2 99 % 10/01/22 1136  Vitals shown include unvalidated device data.  Last Pain:  Vitals:   10/01/22 1134  TempSrc:   PainSc: 0-No pain         Complications: No notable events documented.

## 2022-10-02 ENCOUNTER — Encounter: Payer: Self-pay | Admitting: Gastroenterology

## 2022-10-02 LAB — SURGICAL PATHOLOGY
